# Patient Record
Sex: Female | Born: 1990 | Race: Black or African American | Hispanic: No | Marital: Single | Smoking: Current every day smoker
Health system: Southern US, Community
[De-identification: ages and names within clinical notes are randomized; demographics above are authoritative.]

## PROBLEM LIST (undated history)

## (undated) DIAGNOSIS — D649 Anemia, unspecified: Secondary | ICD-10-CM

## (undated) HISTORY — PX: WISDOM TOOTH EXTRACTION: SHX21

---
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
## 2008-01-16 ENCOUNTER — Emergency Department: Payer: Self-pay | Admitting: Internal Medicine

## 2011-02-02 ENCOUNTER — Emergency Department: Payer: Self-pay | Admitting: Emergency Medicine

## 2011-02-20 DIAGNOSIS — Z8742 Personal history of other diseases of the female genital tract: Secondary | ICD-10-CM | POA: Insufficient documentation

## 2011-04-26 ENCOUNTER — Emergency Department: Payer: Self-pay | Admitting: Emergency Medicine

## 2011-05-23 ENCOUNTER — Ambulatory Visit: Payer: Self-pay | Admitting: Advanced Practice Midwife

## 2011-10-01 ENCOUNTER — Observation Stay: Payer: Self-pay

## 2011-10-01 ENCOUNTER — Inpatient Hospital Stay: Payer: Self-pay | Admitting: Obstetrics and Gynecology

## 2014-02-11 ENCOUNTER — Emergency Department: Payer: Self-pay | Admitting: Emergency Medicine

## 2015-04-05 ENCOUNTER — Encounter: Payer: Self-pay | Admitting: Emergency Medicine

## 2015-04-05 ENCOUNTER — Emergency Department
Admission: EM | Admit: 2015-04-05 | Discharge: 2015-04-05 | Disposition: A | Discharge status: Home / Self Care | Payer: Self-pay | Attending: Student | Admitting: Student

## 2015-04-05 DIAGNOSIS — Y9389 Activity, other specified: Secondary | ICD-10-CM | POA: Insufficient documentation

## 2015-04-05 DIAGNOSIS — R509 Fever, unspecified: Secondary | ICD-10-CM | POA: Insufficient documentation

## 2015-04-05 DIAGNOSIS — S1086XA Insect bite of other specified part of neck, initial encounter: Secondary | ICD-10-CM | POA: Insufficient documentation

## 2015-04-05 DIAGNOSIS — Y998 Other external cause status: Secondary | ICD-10-CM | POA: Insufficient documentation

## 2015-04-05 DIAGNOSIS — S40862A Insect bite (nonvenomous) of left upper arm, initial encounter: Secondary | ICD-10-CM | POA: Insufficient documentation

## 2015-04-05 DIAGNOSIS — Z79899 Other long term (current) drug therapy: Secondary | ICD-10-CM | POA: Insufficient documentation

## 2015-04-05 DIAGNOSIS — R238 Other skin changes: Secondary | ICD-10-CM | POA: Insufficient documentation

## 2015-04-05 DIAGNOSIS — W57XXXA Bitten or stung by nonvenomous insect and other nonvenomous arthropods, initial encounter: Secondary | ICD-10-CM | POA: Insufficient documentation

## 2015-04-05 DIAGNOSIS — Y9289 Other specified places as the place of occurrence of the external cause: Secondary | ICD-10-CM | POA: Insufficient documentation

## 2015-04-05 DIAGNOSIS — B001 Herpesviral vesicular dermatitis: Secondary | ICD-10-CM

## 2015-04-05 DIAGNOSIS — Z720 Tobacco use: Secondary | ICD-10-CM | POA: Insufficient documentation

## 2015-04-05 HISTORY — DX: Anemia, unspecified: D64.9

## 2015-04-05 MED ORDER — RANITIDINE HCL 150 MG PO TABS: 150.0000 mg | ORAL_TABLET | Freq: Two times a day (BID) | ORAL | Status: DC

## 2015-04-05 MED ORDER — HYDROCORTISONE 1 % EX CREA: 1.0000 "application " | TOPICAL_CREAM | Freq: Two times a day (BID) | CUTANEOUS | Status: DC

## 2015-04-05 MED ORDER — ACYCLOVIR 5 % EX CREA: 1.0000 "application " | TOPICAL_CREAM | CUTANEOUS | Status: DC

## 2015-04-05 MED ORDER — HYDROXYZINE PAMOATE 25 MG PO CAPS: ORAL_CAPSULE | ORAL | Status: DC

## 2015-04-05 NOTE — Discharge Instructions (Signed)
Cold Sore A cold sore (fever blister) is a skin infection caused by the herpes simplex virus (HSV-1). HSV-1 is closely related to the virus that causes genital herpes (HSV-2), but they are not the same even though both viruses can cause oral and genital infections. Cold sores are small, fluid-filled sores inside of the mouth or on the lips, gums, nose, chin, cheeks, or fingers.  The herpes simplex virus can be easily passed (contagious) to other people through close personal contact, such as kissing or sharing personal items. The virus can also spread to other parts of the body, such as the eyes or genitals. Cold sores are contagious until the sores crust over completely. They often heal within 2 weeks.  Once a person is infected, the herpes simplex virus remains permanently in the body. Therefore, there is no cure for cold sores, and they often recur when a person is tired, stressed, sick, or gets too much sun. Additional factors that can cause a recurrence include hormone changes in menstruation or pregnancy, certain drugs, and cold weather.  CAUSES  Cold sores are caused by the herpes simplex virus. The virus is spread from person to person through close contact, such as through kissing, touching the affected area, or sharing personal items such as lip balm, razors, or eating utensils.  SYMPTOMS  The first infection may not cause symptoms. If symptoms develop, the symptoms often go through different stages. Here is how a cold sore develops:   Tingling, itching, or burning is felt 1-2 days before the outbreak.   Fluid-filled blisters appear on the lips, inside the mouth, nose, or on the cheeks.   The blisters start to ooze clear fluid.   The blisters dry up and a yellow crust appears in its place.   The crust falls off.  Symptoms depend on whether it is the initial outbreak or a recurrence. Some other symptoms with the first outbreak may include:   Fever.   Sore throat.   Headache.    Muscle aches.   Swollen neck glands.  DIAGNOSIS  A diagnosis is often made based on your symptoms and looking at the sores. Sometimes, a sore may be swabbed and then examined in the lab to make a final diagnosis. If the sores are not present, blood tests can find the herpes simplex virus.  TREATMENT  There is no cure for cold sores and no vaccine for the herpes simplex virus. Within 2 weeks, most cold sores go away on their own without treatment. Medicines cannot make the infection go away, but medicine can help relieve some of the pain associated with the sores, can work to stop the virus from multiplying, and can also shorten healing time. Medicine may be in the form of creams, gels, pills, or a shot.  HOME CARE INSTRUCTIONS   Only take over-the-counter or prescription medicines for pain, discomfort, or fever as directed by your caregiver. Do not use aspirin.   Use a cotton-tip swab to apply creams or gels to your sores.   Do not touch the sores or pick the scabs. Wash your hands often. Do not touch your eyes without washing your hands first.   Avoid kissing, oral sex, and sharing personal items until sores heal.   Apply an ice pack on your sores for 10-15 minutes to ease any discomfort.   Avoid hot, cold, or salty foods because they may hurt your mouth. Eat a soft, bland diet to avoid irritating the sores. Use a straw to drink   if you have pain when drinking out of a glass.   Keep sores clean and dry to prevent an infection of other tissues.   Avoid the sun and limit stress if these things trigger outbreaks. If sun causes cold sores, apply sunscreen on the lips before being out in the sun.  SEEK MEDICAL CARE IF:   You have a fever or persistent symptoms for more than 2-3 days.   You have a fever and your symptoms suddenly get worse.   You have pus, not clear fluid, coming from the sores.   You have redness that is spreading.   You have pain or irritation in your  eye.   You get sores on your genitals.   Your sores do not heal within 2 weeks.   You have a weakened immune system.   You have frequent recurrences of cold sores.  MAKE SURE YOU:   Understand these instructions.  Will watch your condition.  Will get help right away if you are not doing well or get worse. Document Released: 11/15/2000 Document Revised: 04/04/2014 Document Reviewed: 04/01/2012 [facility] Patient Information 2015 [place], [place]. This information is not intended to replace advice given to you by your health care provider. Make sure you discuss any questions you have with your health care provider.  Insect Bite Mosquitoes, flies, fleas, bedbugs, and many other insects can bite. Insect bites are different from insect stings. A sting is when venom is injected into the skin. Some insect bites can transmit infectious diseases. SYMPTOMS  Insect bites usually turn red, swell, and itch for 2 to 4 days. They often go away on their own. TREATMENT  Your caregiver may prescribe antibiotic medicines if a bacterial infection develops in the bite. HOME CARE INSTRUCTIONS  Do not scratch the bite area.  Keep the bite area clean and dry. Wash the bite area thoroughly with soap and water.  Put ice or cool compresses on the bite area.  Put ice in a plastic bag.  Place a towel between your skin and the bag.  Leave the ice on for 20 minutes, 4 times a day for the first 2 to 3 days, or as directed.  You may apply a baking soda paste, cortisone cream, or calamine lotion to the bite area as directed by your caregiver. This can help reduce itching and swelling.  Only take over-the-counter or prescription medicines as directed by your caregiver.  If you are given antibiotics, take them as directed. Finish them even if you start to feel better. You may need a tetanus shot if:  You cannot remember when you had your last tetanus shot.  You have never had a tetanus shot.  The  injury broke your skin. If you get a tetanus shot, your arm may swell, get red, and feel warm to the touch. This is common and not a problem. If you need a tetanus shot and you choose not to have one, there is a rare chance of getting tetanus. Sickness from tetanus can be serious. SEEK IMMEDIATE MEDICAL CARE IF:   You have increased pain, redness, or swelling in the bite area.  You see a red line on the skin coming from the bite.  You have a fever.  You have joint pain.  You have a headache or neck pain.  You have unusual weakness.  You have a rash.  You have chest pain or shortness of breath.  You have abdominal pain, nausea, or vomiting.  You feel unusually tired  or sleepy. MAKE SURE YOU:   Understand these instructions.  Will watch your condition.  Will get help right away if you are not doing well or get worse. Document Released: 12/26/2004 Document Revised: 02/10/2012 Document Reviewed: 06/19/2011 [facility] Patient Information 2015 [place], [place]. This information is not intended to replace advice given to you by your health care provider. Make sure you discuss any questions you have with your health care provider.

## 2015-04-05 NOTE — ED Notes (Signed)
Pt states "possible bug bite" on let arm and left neck.  NAD

## 2015-04-05 NOTE — ED Provider Notes (Signed)
[facility] Provider Note   ____________________________________________  Time seen: 1030  I have reviewed the triage vital signs and the nursing notes.   HISTORY  Chief Complaint Abscess      HPI Dominique Ware is a 24 y.o. female complains of being bitten on her left side of her neck and left upper arm. Noticed symptoms onset yesterday morning. In addition complains of having a cold sore to her left outer lip. Since she's tried rubbing alcohol with no relief and is concerned that it is read irritating hurts and itches. Denies any difficulty swallowing or shortness of breath or chest pains no vision pains denies any nausea vomiting diarrhea constipation .    Past Medical History  Diagnosis Date  . Anemia     There are no active problems to display for this patient.   History reviewed. No pertinent past surgical history.  Current Outpatient Rx  Name  Route  Sig  Dispense  Refill  . acyclovir cream (ZOVIRAX) 5 %   Topical   Apply 1 application topically every 3 (three) hours.   15 g   0   . hydrocortisone cream 1 %   Topical   Apply 1 application topically 2 (two) times daily.   30 g   0   . hydrOXYzine (VISTARIL) 25 MG capsule      Take 1-2 tablets every 6 hours as needed for itching.   30 capsule   0   . ranitidine (ZANTAC) 150 MG tablet   Oral   Take 1 tablet (150 mg total) by mouth 2 (two) times daily.   20 tablet   1     Allergies Review of patient's allergies indicates no known allergies.  History reviewed. No pertinent family history.  Social History History  Substance Use Topics  . Smoking status: Current Some Day Smoker    Types: Cigarettes  . Smokeless tobacco: Never Used  . Alcohol Use: Yes     Comment: on occassion    Review of Systems  Constitutional: Negative for fever. ENT: Negative for sore throat. Cardiovascular: Negative for chest pain. Respiratory: Negative for shortness of  breath. Gastrointestinal: Negative for abdominal pain, vomiting and diarrhea. Skin: Positive for 3 cm lesion to left upper arm. And approximately 1/2 cm lesion to left lateral neck. Also noted is a fever blister noted on the left lower lip. Neurological: Negative for headaches, focal weakness or numbness.   10-point ROS otherwise negative.  ____________________________________________   PHYSICAL EXAM:  VITAL SIGNS: ED Triage Vitals  Enc Vitals Group     BP 04/05/15 0926 108/79 mmHg     Pulse Rate 04/05/15 0926 78     Resp 04/05/15 0926 18     Temp 04/05/15 0926 97.5 F (36.4 C)     Temp Source 04/05/15 0926 Oral     SpO2 04/05/15 0926 98 %     Weight 04/05/15 0926 126 lb (57.153 kg)     Height 04/05/15 0926 5\' 3"  (1.6 m)     Head Cir --      Peak Flow --      Pain Score 04/05/15 0925 6     Pain Loc --      Pain Edu? --      Excl. in GC? --    Constitutional: Alert and oriented. Well appearing and in no distress.  ENT   Head: Normocephalic and atraumatic.   Nose: No congestion/rhinnorhea.   Mouth/Throat: Mucous membranes are  moist.a fever blister noted on the left lower lip.    Neck: No stridor. Hematological/Lymphatic/Immunilogical: No cervical lymphadenopathy. Cardiovascular: Normal rate, regular rhythm. Normal and symmetric distal pulses are present in all extremities. No murmurs, rubs, or gallops. Respiratory: Normal respiratory effort without tachypnea nor retractions. Breath sounds are clear and equal bilaterally. No wheezes/rales/rhonchi. Musculoskeletal: Nontender with normal range of motion in all extremities. No joint effusions.  No lower extremity tenderness nor edema. Neurologic:  Normal speech and language. No gross focal neurologic deficits are appreciated. Speech is normal. No gait instability. Skin:  Skin is warm, dry and intact.Positive for 3 cm lesion to left upper arm. And approximately 1/2 cm lesion to left lateral neck. Also noted is a fever  blister noted on the left lower lip. No rash noted. Psychiatric: Mood and affect are normal. Speech and behavior are normal. Patient exhibits appropriate insight and judgment.  ____________________________________________    LABS (pertinent positives/negatives)    ____________________________________________   EKG    ____________________________________________    RADIOLOGY    ____________________________________________   PROCEDURES  Procedure(s) performed: None  Critical Care performed: No  ____________________________________________   INITIAL IMPRESSION / ASSESSMENT AND PLAN / ED COURSE  Pertinent labs & imaging results that were available during my care of the patient were reviewed by me and considered in my medical decision making (see chart for details).  Reassured patient that lesions were probably due to an insect bite of unknown origin we'll treat conservatively with hydrocortisone cream in Zantac and Atarax for itching return to work as needed. We'll give acyclovir for fever blisters.  ____________________________________________   FINAL CLINICAL IMPRESSION(S) / ED DIAGNOSES  Final diagnoses:  Insect bite of arm, left, initial encounter  Fever blister     Evangeline Dakinharles M Beers, PA-C 04/05/15 1047  Gayla DossEryka A Gayle, MD 04/05/15 [phone]

## 2015-04-05 NOTE — ED Notes (Signed)
Pt unable to sign instructions, as it will not print and esig is not working.  Pt verbalized instrucitons

## 2015-12-03 NOTE — L&D Delivery Note (Signed)
Delivery Note At 6:36 PM a viable and healthy female "Dominique Ware"  was delivered via Vaginal, Spontaneous Delivery (Presentation:ROA ).  APGAR: 8,9 ; weight 2560g .   Placenta status: intact , .  Cord: nuchal x2, 3 vessel cord.   Anesthesia:  none Episiotomy: None Lacerations: None Suture Repair: n/a Est. Blood Loss (mL): 0  Mom to postpartum.  Baby to Couplet care / Skin to Skin.  Pt admitted in active preterm labor and progressed rapidly to fully dilated without anesthesia. She was GBS positive but progressed rapidly without getting full 4 hours of ampicillin. AROM for clear fluid 7 min before delivery. Head delivered with nuchal cord x2, which were delivered through. Delayed cord clamping x60 secs. Baby to maternal abdomen vigorous and crying. Placenta delivered spontaneously and intact with trailing membranes. Postpartum pit started and fundus firm. Uncomplicated delivery.  Christeen DouglasBEASLEY, Carlee Tesfaye 08/02/2016, 7:01 PM

## 2016-01-16 ENCOUNTER — Emergency Department
Admission: EM | Admit: 2016-01-16 | Discharge: 2016-01-16 | Disposition: A | Discharge status: Home / Self Care | Payer: Medicaid Other | Attending: Emergency Medicine | Admitting: Emergency Medicine

## 2016-01-16 ENCOUNTER — Encounter: Payer: Self-pay | Admitting: *Deleted

## 2016-01-16 DIAGNOSIS — Z79899 Other long term (current) drug therapy: Secondary | ICD-10-CM | POA: Insufficient documentation

## 2016-01-16 DIAGNOSIS — N3000 Acute cystitis without hematuria: Secondary | ICD-10-CM

## 2016-01-16 DIAGNOSIS — Z3A01 Less than 8 weeks gestation of pregnancy: Secondary | ICD-10-CM | POA: Insufficient documentation

## 2016-01-16 DIAGNOSIS — O2311 Infections of bladder in pregnancy, first trimester: Secondary | ICD-10-CM | POA: Insufficient documentation

## 2016-01-16 DIAGNOSIS — O2341 Unspecified infection of urinary tract in pregnancy, first trimester: Secondary | ICD-10-CM | POA: Insufficient documentation

## 2016-01-16 DIAGNOSIS — O210 Mild hyperemesis gravidarum: Secondary | ICD-10-CM | POA: Diagnosis present

## 2016-01-16 DIAGNOSIS — O99331 Smoking (tobacco) complicating pregnancy, first trimester: Secondary | ICD-10-CM | POA: Insufficient documentation

## 2016-01-16 DIAGNOSIS — F17210 Nicotine dependence, cigarettes, uncomplicated: Secondary | ICD-10-CM | POA: Diagnosis not present

## 2016-01-16 DIAGNOSIS — Z7952 Long term (current) use of systemic steroids: Secondary | ICD-10-CM | POA: Insufficient documentation

## 2016-01-16 DIAGNOSIS — R112 Nausea with vomiting, unspecified: Secondary | ICD-10-CM

## 2016-01-16 LAB — COMPREHENSIVE METABOLIC PANEL
ALT: 19 U/L (ref 14–54)
AST: 19 U/L (ref 15–41)
Albumin: 4.1 g/dL (ref 3.5–5.0)
Alkaline Phosphatase: 49 U/L (ref 38–126)
Anion gap: 8 (ref 5–15)
BUN: 14 mg/dL (ref 6–20)
CHLORIDE: 105 mmol/L (ref 101–111)
CO2: 24 mmol/L (ref 22–32)
Calcium: 9.2 mg/dL (ref 8.9–10.3)
Creatinine, Ser: 0.51 mg/dL (ref 0.44–1.00)
GFR calc Af Amer: >60 mL/min (ref >60)
GFR calc non Af Amer: >60 mL/min (ref >60)
GLUCOSE: 87 mg/dL (ref 65–99)
Potassium: 3.7 mmol/L (ref 3.5–5.1)
SODIUM: 137 mmol/L (ref 135–145)
Total Bilirubin: 1.2 mg/dL (ref 0.3–1.2)
Total Protein: 7.9 g/dL (ref 6.5–8.1)

## 2016-01-16 LAB — CBC
HEMATOCRIT: 39.2 % (ref 35.0–47.0)
Hemoglobin: 13.3 g/dL (ref 12.0–16.0)
MCH: 28.1 pg (ref 26.0–34.0)
MCHC: 34 g/dL (ref 32.0–36.0)
MCV: 82.7 fL (ref 80.0–100.0)
Platelets: 303 10*3/uL (ref 150–440)
RBC: 4.74 MIL/uL (ref 3.80–5.20)
RDW: 13.6 % (ref 11.5–14.5)
WBC: 7.2 10*3/uL (ref 3.6–11.0)

## 2016-01-16 LAB — URINALYSIS COMPLETE WITH MICROSCOPIC (ARMC ONLY)
Bilirubin Urine: NEGATIVE
GLUCOSE, UA: NEGATIVE mg/dL
HGB URINE DIPSTICK: NEGATIVE
Nitrite: POSITIVE — AB
Protein, ur: 30 mg/dL — AB
Specific Gravity, Urine: 1.025 (ref 1.005–1.030)
pH: 6 (ref 5.0–8.0)

## 2016-01-16 LAB — HCG, QUANTITATIVE, PREGNANCY: HCG, BETA CHAIN, QUANT, S: 119277 m[IU]/mL — AB (ref <5)

## 2016-01-16 LAB — LIPASE, BLOOD: LIPASE: 32 U/L (ref 11–51)

## 2016-01-16 MED ORDER — METOCLOPRAMIDE HCL 10 MG PO TABS
10.0000 mg | ORAL_TABLET | Freq: Once | ORAL | Status: AC
  Administered 2016-01-16: 10 mg via ORAL
  Filled 2016-01-16: qty 1

## 2016-01-16 MED ORDER — METOCLOPRAMIDE HCL 10 MG PO TABS: 10.0000 mg | ORAL_TABLET | Freq: Three times a day (TID) | ORAL | Status: DC | PRN

## 2016-01-16 MED ORDER — CEPHALEXIN 500 MG PO CAPS
500.0000 mg | ORAL_CAPSULE | Freq: Once | ORAL | Status: AC
  Administered 2016-01-16: 500 mg via ORAL
  Filled 2016-01-16: qty 1

## 2016-01-16 MED ORDER — CEPHALEXIN 500 MG PO CAPS: 500.0000 mg | ORAL_CAPSULE | Freq: Three times a day (TID) | ORAL | Status: AC

## 2016-01-16 MED ORDER — ACETAMINOPHEN 325 MG PO TABS
650.0000 mg | ORAL_TABLET | Freq: Once | ORAL | Status: AC
  Administered 2016-01-16: 650 mg via ORAL
  Filled 2016-01-16: qty 2

## 2016-01-16 NOTE — ED Provider Notes (Signed)
[facility] Emergency Department Provider Note  ____________________________________________  Time seen: Approximately 424 AM  I have reviewed the triage vital signs and the nursing notes.   HISTORY  Chief Complaint Nausea and Abdominal Pain    HPI Dominique Ware is a 25 y.o. female who comes in to the hospital today with back pain nausea and vomiting. The patient also reports that she has pain in her sides. The patient is about [redacted] weeks pregnant and has prenatal vitamins but does not take them. The patient has not yet seen a doctor for her prenatal care because she so early. She reports that throughout the day she gets sick and cannot keep liquids down. She reports that her sides and her back hurt more than usual and she is unsure if that is due to her standing for multiple hours a day. Due to the symptoms the patient wanted to get seen. She's had back pain since the beginning of her pregnancy and she found out about February 1. The patient's last unsure. Was the end of December and she is a G2 P1. The patient rates her pain a 5 out of 10 in intensity but reports that when the batter to 7. She's had no fevers and no pain with urination.   Past Medical History  Diagnosis Date  . Anemia     There are no active problems to display for this patient.   No past surgical history  Current Outpatient Rx  Name  Route  Sig  Dispense  Refill  . acyclovir cream (ZOVIRAX) 5 %   Topical   Apply 1 application topically every 3 (three) hours.   15 g   0   . cephALEXin (KEFLEX) 500 MG capsule   Oral   Take 1 capsule (500 mg total) by mouth 3 (three) times daily.   30 capsule   0   . hydrocortisone cream 1 %   Topical   Apply 1 application topically 2 (two) times daily.   30 g   0   . hydrOXYzine (VISTARIL) 25 MG capsule      Take 1-2 tablets every 6 hours as needed for itching.   30 capsule   0   . metoCLOPramide (REGLAN) 10 MG tablet   Oral   Take 1  tablet (10 mg total) by mouth every 8 (eight) hours as needed for nausea.   20 tablet   0   . ranitidine (ZANTAC) 150 MG tablet   Oral   Take 1 tablet (150 mg total) by mouth 2 (two) times daily.   20 tablet   1     Allergies Review of patient's allergies indicates no known allergies.  No family history on file.  Social History Social History  Substance Use Topics  . Smoking status: Current Some Day Smoker    Types: Cigarettes  . Smokeless tobacco: Never Used  . Alcohol Use: Yes     Comment: on occassion    Review of Systems Constitutional: No fever/chills Eyes: No visual changes. ENT: No sore throat. Cardiovascular: Denies chest pain. Respiratory: Denies shortness of breath. Gastrointestinal: Nausea and vomiting with No abdominal pain  No diarrhea.  No constipation. Genitourinary: Negative for dysuria. Musculoskeletal:  back pain. Skin: Negative for rash. Neurological: Negative for headaches, focal weakness or numbness.  10-point ROS otherwise negative.  ____________________________________________   PHYSICAL EXAM:  VITAL SIGNS: ED Triage Vitals  Enc Vitals Group     BP 01/16/16 0146 115/80 mmHg  Pulse Rate 01/16/16 0146 75     Resp 01/16/16 0146 20     Temp 01/16/16 0146 97.8 F (36.6 C)     Temp Source 01/16/16 0146 Oral     SpO2 01/16/16 0146 99 %     Weight 01/16/16 0146 116 lb (52.617 kg)     Height 01/16/16 0146  (1.575 m)     Head Cir --      Peak Flow --      Pain Score 01/16/16 0146 6     Pain Loc --      Pain Edu? --      Excl. in GC? --     Constitutional: Alert and oriented. Well appearing and in mild distress. Eyes: Conjunctivae are normal. PERRL. EOMI. Head: Atraumatic. Nose: No congestion/rhinnorhea. Mouth/Throat: Mucous membranes are moist.  Oropharynx non-erythematous. Cardiovascular: Normal rate, regular rhythm. Grossly normal heart sounds.  Good peripheral circulation. Respiratory: Normal respiratory effort.  No  retractions. Lungs CTAB. Gastrointestinal: Soft and nontender. No distention. Positive bowel sounds with bilateral CVA tenderness to palpation Musculoskeletal: No lower extremity tenderness nor edema.  Neurologic:  Normal speech and language.  Skin:  Skin is warm, dry and intact.  Psychiatric: Mood and affect are normal.  ____________________________________________   LABS (all labs ordered are listed, but only abnormal results are displayed)  Labs Reviewed  URINALYSIS COMPLETEWITH MICROSCOPIC ([facility] ONLY) - Abnormal; Notable for the following:    Color, Urine AMBER (*)    APPearance CLOUDY (*)    Ketones, ur TRACE (*)    Protein, ur 30 (*)    Nitrite POSITIVE (*)    Leukocytes, UA 1+ (*)    Bacteria, UA MANY (*)    Squamous Epithelial / LPF 0-5 (*)    All other components within normal limits  HCG, QUANTITATIVE, PREGNANCY - Abnormal; Notable for the following:    hCG, Beta Francene Finders [id number] (*)    All other components within normal limits  URINE CULTURE  LIPASE, BLOOD  COMPREHENSIVE METABOLIC PANEL  CBC   ____________________________________________  EKG  None ____________________________________________  RADIOLOGY  None ____________________________________________   PROCEDURES  Procedure(s) performed: None  Critical Care performed: No  ____________________________________________   INITIAL IMPRESSION / ASSESSMENT AND PLAN / ED COURSE  Pertinent labs & imaging results that were available during my care of the patient were reviewed by me and considered in my medical decision making (see chart for details).  This is a 25 year old female who comes to the hospital with bilateral back pain. The patient is [redacted] weeks pregnant. On evaluation the patient has a normal CBC and a normal CMP but it appears she does have a urinary tract infection. She has too numerous to count white blood cells, bacteria and positive nitrates. I will give the patient a dose of Reglan,  Tylenol and some Keflex. If the patient is able to keep it down she'll be discharged home.  The patient was able to take her medication without any episodes of vomiting. The patient feels improved and she is ready to be discharged home. The patient should follow-up with health department or with Dr. Valentino Saxon from OB/GYN. ____________________________________________   FINAL CLINICAL IMPRESSION(S) / ED DIAGNOSES  Final diagnoses:  Acute cystitis without hematuria  UTI in pregnancy, antepartum, first trimester  Non-intractable vomiting with nausea, vomiting of unspecified type      Rebecka Apley, MD 01/16/16 [phone]

## 2016-01-16 NOTE — Discharge Instructions (Signed)
Nausea and Vomiting °Nausea is a sick feeling that often comes before throwing up (vomiting). Vomiting is a reflex where stomach contents come out of your mouth. Vomiting can cause severe loss of body fluids (dehydration). Children and elderly adults can become dehydrated quickly, especially if they also have diarrhea. Nausea and vomiting are symptoms of a condition or disease. It is important to find the cause of your symptoms. °CAUSES  °· Direct irritation of the stomach lining. This irritation can result from increased acid production (gastroesophageal reflux disease), infection, food poisoning, taking certain medicines (such as nonsteroidal anti-inflammatory drugs), alcohol use, or tobacco use. °· Signals from the brain. These signals could be caused by a headache, heat exposure, an inner ear disturbance, increased pressure in the brain from injury, infection, a tumor, or a concussion, pain, emotional stimulus, or metabolic problems. °· An obstruction in the gastrointestinal tract (bowel obstruction). °· Illnesses such as diabetes, hepatitis, gallbladder problems, appendicitis, kidney problems, cancer, sepsis, atypical symptoms of a heart attack, or eating disorders. °· Medical treatments such as chemotherapy and radiation. °· Receiving medicine that makes you sleep (general anesthetic) during surgery. °DIAGNOSIS °Your caregiver may ask for tests to be done if the problems do not improve after a few days. Tests may also be done if symptoms are severe or if the reason for the nausea and vomiting is not clear. Tests may include: °· Urine tests. °· Blood tests. °· Stool tests. °· Cultures (to look for evidence of infection). °· X-rays or other imaging studies. °Test results can help your caregiver make decisions about treatment or the need for additional tests. °TREATMENT °You need to stay well hydrated. Drink frequently but in small amounts. You may wish to drink water, sports drinks, clear broth, or eat frozen  ice pops or gelatin dessert to help stay hydrated. When you eat, eating slowly may help prevent nausea. There are also some antinausea medicines that may help prevent nausea. °HOME CARE INSTRUCTIONS  °· Take all medicine as directed by your caregiver. °· If you do not have an appetite, do not force yourself to eat. However, you must continue to drink fluids. °· If you have an appetite, eat a normal diet unless your caregiver tells you differently. °¨ Eat a variety of complex carbohydrates (rice, wheat, potatoes, bread), lean meats, yogurt, fruits, and vegetables. °¨ Avoid high-fat foods because they are more difficult to digest. °· Drink enough water and fluids to keep your urine clear or pale yellow. °· If you are dehydrated, ask your caregiver for specific rehydration instructions. Signs of dehydration may include: °¨ Severe thirst. °¨ Dry lips and mouth. °¨ Dizziness. °¨ Dark urine. °¨ Decreasing urine frequency and amount. °¨ Confusion. °¨ Rapid breathing or pulse. °SEEK IMMEDIATE MEDICAL CARE IF:  °· You have blood or brown flecks (like coffee grounds) in your vomit. °· You have black or bloody stools. °· You have a severe headache or stiff neck. °· You are confused. °· You have severe abdominal pain. °· You have chest pain or trouble breathing. °· You do not urinate at least once every 8 hours. °· You develop cold or clammy skin. °· You continue to vomit for longer than 24 to 48 hours. °· You have a fever. °MAKE SURE YOU:  °· Understand these instructions. °· Will watch your condition. °· Will get help right away if you are not doing well or get worse. °  °This information is not intended to replace advice given to you by your health care provider. Make sure   you discuss any questions you have with your health care provider. °  °Document Released: 11/18/2005 Document Revised: 02/10/2012 Document Reviewed: 04/17/2011 °Elsevier Interactive Patient Education ©2016 [employer] °Pregnancy and Urinary Tract  Infection °A urinary tract infection (UTI) is a bacterial infection of the urinary tract. Infection of the urinary tract can include the ureters, kidneys (pyelonephritis), bladder (cystitis), and urethra (urethritis). All pregnant women should be screened for bacteria in the urinary tract. Identifying and treating a UTI will decrease the risk of preterm labor and developing more serious infections in both the mother and baby. °CAUSES °Bacteria germs cause almost all UTIs.  °RISK FACTORS °Many factors can increase your chances of getting a UTI during pregnancy. These include: °· Having a short urethra. °· Poor toilet and hygiene habits. °· Sexual intercourse. °· Blockage of urine along the urinary tract. °· Problems with the pelvic muscles or nerves. °· Diabetes. °· Obesity. °· Bladder problems after having several children. °· Previous history of UTI. °SIGNS AND SYMPTOMS  °· Pain, burning, or a stinging feeling when urinating. °· Suddenly feeling the need to urinate right away (urgency). °· Loss of bladder control (urinary incontinence). °· Frequent urination, more than is common with pregnancy. °· Lower abdominal or back discomfort. °· Cloudy urine. °· Blood in the urine (hematuria). °· Fever.  °When the kidneys are infected, the symptoms may be: °· Back pain. °· Flank pain on the right side more so than the left. °· Fever. °· Chills. °· Nausea. °· Vomiting. °DIAGNOSIS  °A urinary tract infection is usually diagnosed through urine tests. Additional tests and procedures are sometimes done. These may include: °· Ultrasound exam of the kidneys, ureters, bladder, and urethra. °· Looking in the bladder with a lighted tube (cystoscopy). °TREATMENT °Typically, UTIs can be treated with antibiotic medicines.  °HOME CARE INSTRUCTIONS  °· Only take over-the-counter or prescription medicines as directed by your health care provider. If you were prescribed antibiotics, take them as directed. Finish them even if you start to  feel better. °· Drink enough fluids to keep your urine clear or pale yellow. °· Do not have sexual intercourse until the infection is gone and your health care provider says it is okay. °· Make sure you are tested for UTIs throughout your pregnancy. These infections often come back.  °Preventing a UTI in the Future °· Practice good toilet habits. Always wipe from front to back. Use the tissue only once. °· Do not hold your urine. Empty your bladder as soon as possible when the urge comes. °· Do not douche or use deodorant sprays. °· Wash with soap and warm water around the genital area and the anus. °· Empty your bladder before and after sexual intercourse. °· Wear underwear with a cotton crotch. °· Avoid caffeine and carbonated drinks. They can irritate the bladder. °· Drink cranberry juice or take cranberry pills. This may decrease the risk of getting a UTI. °· Do not drink alcohol. °· Keep all your appointments and tests as scheduled.  °SEEK MEDICAL CARE IF:  °· Your symptoms get worse. °· You are still having fevers 2 or more days after treatment begins. °· You have a rash. °· You feel that you are having problems with medicines prescribed. °· You have abnormal vaginal discharge. °SEEK IMMEDIATE MEDICAL CARE IF:  °· You have back or flank pain. °· You have chills. °· You have blood in your urine. °· You have nausea and vomiting. °· You have contractions of your uterus. °· You have a gush   of fluid from the vagina. °MAKE SURE YOU: °· Understand these instructions.   °· Will watch your condition.   °· Will get help right away if you are not doing well or get worse.   °  °This information is not intended to replace advice given to you by your health care provider. Make sure you discuss any questions you have with your health care provider. °  °Document Released: 03/15/2011 Document Revised: 09/08/2013 Document Reviewed: 06/17/2013 °Elsevier Interactive Patient Education ©2016 [employer]. ° °

## 2016-01-16 NOTE — ED Notes (Addendum)
Pt reports being 6.[redacted] weeks pregnant, reports n/v w/ lower abd and back pain x 1 week.  Pt reports this is second pregnancy.  Pt reports vomit x 6 in last 24 hours.  Pt reports diarrhea throughout pregnancy, x3 in past 24 hours.  Pt NAD at this time, respirations equal and unlabored.

## 2016-01-16 NOTE — ED Notes (Signed)
Pt reports she has pressure and pain in lower abd .  Pt is approx [redacted] weeks pregnant.  No vag bleeding.  Pt reports low back pain.

## 2016-01-18 LAB — URINE CULTURE

## 2016-02-14 LAB — OB RESULTS CONSOLE RUBELLA ANTIBODY, IGM: RUBELLA: IMMUNE

## 2016-02-14 LAB — OB RESULTS CONSOLE HEPATITIS B SURFACE ANTIGEN: Hepatitis B Surface Ag: NEGATIVE

## 2016-02-14 LAB — OB RESULTS CONSOLE HIV ANTIBODY (ROUTINE TESTING): HIV: NONREACTIVE

## 2016-02-14 LAB — OB RESULTS CONSOLE VARICELLA ZOSTER ANTIBODY, IGG: VARICELLA IGG: IMMUNE

## 2016-03-19 ENCOUNTER — Other Ambulatory Visit: Payer: Self-pay | Admitting: Obstetrics and Gynecology

## 2016-03-19 DIAGNOSIS — R772 Abnormality of alphafetoprotein: Secondary | ICD-10-CM

## 2016-04-01 ENCOUNTER — Ambulatory Visit: Payer: Medicaid Other | Attending: Obstetrics and Gynecology

## 2016-04-01 ENCOUNTER — Ambulatory Visit: Payer: Medicaid Other

## 2016-04-04 ENCOUNTER — Ambulatory Visit (HOSPITAL_BASED_OUTPATIENT_CLINIC_OR_DEPARTMENT_OTHER)
Admission: RE | Admit: 2016-04-04 | Discharge: 2016-04-04 | Disposition: A | Discharge status: Home / Self Care | Payer: Medicaid Other | Source: Ambulatory Visit | Attending: Obstetrics and Gynecology | Admitting: Obstetrics and Gynecology

## 2016-04-04 ENCOUNTER — Ambulatory Visit
Admission: RE | Admit: 2016-04-04 | Discharge: 2016-04-04 | Disposition: A | Discharge status: Home / Self Care | Payer: Medicaid Other | Source: Ambulatory Visit | Attending: Obstetrics and Gynecology | Admitting: Obstetrics and Gynecology

## 2016-04-04 VITALS — BP 113/73 | HR 98 | Temp 98.2°F | Resp 18 | Ht 63.6 in | Wt 118.6 lb

## 2016-04-04 DIAGNOSIS — Z36 Encounter for antenatal screening of mother: Secondary | ICD-10-CM | POA: Diagnosis not present

## 2016-04-04 DIAGNOSIS — R772 Abnormality of alphafetoprotein: Secondary | ICD-10-CM

## 2016-04-04 DIAGNOSIS — Z3A19 19 weeks gestation of pregnancy: Secondary | ICD-10-CM | POA: Insufficient documentation

## 2016-04-04 NOTE — Progress Notes (Addendum)
Referring Provider:  [facility] Length of consultation 30 minutes  Dominique Ware was referred for genetic counseling to discuss the results of her Maternal Serum Screen and her prenatal testing options.  This note is a summary of our discussion.  To review, a Maternal Serum AFP is a maternal blood test that measures maternal serum AFP levels to determine if a pregnancy is at higher risk for certain birth defects or problems; however, it cannot diagnose or rule out these conditions.  It is important to remember that an abnormal maternal serum screen (MSS) test does not necessarily mean that the baby has a problem.  Maternal serum screening can identify approximately 80% of open neural tube defects (i.e., spina bifida).  The neural tube consists of the fetal head and spine, and if this structure fails to close during development, spina bifida (open spine) or anencephaly (open skull) could result.   The MSS result revealed an increased level of AFP.  The value was 2.86 MOM (multiples of the median) which is above the cutoff of 2.80 MOM.  Increased levels of AFP occur for many reasons including: inaccurate pregnancy dating, the presence of twins, pregnancy complications, neural tube defects, abdominal wall defects or a normal pregnancy.  Before MSS screening, her risk to have a child with a neural tube defect was 1 or 2 per 1000. Given the elevated AFP value, the risk is now 1 in 299.  This means that the chance that this baby does not have a neural tube defect is >99.5.  We discussed the following prenatal testing options for this pregnancy:   Ultrasound was recommended to evaluate the fetal anatomy, particularly the neural tube and to confirm the gestational age.  An ultrasound performed on 04/04/2016 revealed that the patient was 19 weeks and 5 days pregnant.  There was no evidence of a neural tube defect on this ultrasound.  The fetal anatomy appeared normal, although this cannot rule out all  abnormalities.  Redraw AFP is offered when the level of AFP MoM does not exceed a particular laboratory cutoff.  Redrawing maternal blood for a repeat can provide patients with a better assessment of their risk to have a child with a neural tube defect.  Amniocentesis was offered because the AFP MoM exceeded a particular laboratory cutoff.  Amniocentesis can detect greater than 98% of neural tube defects.  It involves the removal of a small amount of amniotic fluid from the sac surrounding the fetus with the use of a thin needle through the maternal abdomen and uterus.  Fetal cells are directly evaluated and approximately 99% of chromosome conditions may also be detected.  Ultrasound guidance is used throughout the procedure. The main risks to this procedure include complications leading to miscarriage in less than 1 in 200 cases (0.5%).    It is important to remember that each pregnancy in the general population has a 2-3% chance for a birth defect that might not be detected prenatally.  We recommend 0.4mg  of folic acid, a B-complex vitamin, for all women of childbearing age.  Folic acid may help to prevent neural tube defects, and should be taken prior to and during pregnancy.  We obtained a detailed family history. There are no other family members with known genetic conditions, mental retardation or birth defects.  The patient and her partner are both of African American ancestry.  In reviewing her labs, she has a normal MCV and had a normal hemoglobin solubility.  The solubility is accurate for  assessment of S trait, but cannot detect other hemoglobin variants.  If more accurate testing is desired, we would recommend hemoglobin electrophoresis.  We also obtained a detailed pregnancy history. Dominique Ware stated that this is her second pregnancy.  She has a healthy 27 year old son from a prior relationship.  The father of the baby, Dominique Ware, has three healthy children.  Dominique Ware  reported no complications  thus far and deny the use of prescription medications, recreational drugs or alcohol.  She reported smoking 2-3 cigarettes per day.  As we discussed, smoking during pregnancy has been associated with low birth weight, premature delivery and pregnancy loss.  For this reason, we suggest that she cut back or avoid smoking for the remainder of the pregnancy.  After careful consideration, Dominique Ware elected to have a repeat msAFP drawn today.  Unexplained elevations of maternal serum AFP have been associated with adverse pregnancy outcomes such as low birth weight, preterm labor, pre-eclampsia or fetal demise.  For this reason, another ultrasound should be considered at 26 to [redacted] weeks gestation to reassess fetal growth.  If further questions or concerns arise, please do not hesitate to call us at [phone].  Cherly Andersoneborah F. Toyoko Silos, MS, CGC  Addendum 04/15/2016- note corrected with proper ultrasound dating due to typo.

## 2016-04-04 NOTE — Progress Notes (Signed)
Pt met with genetic counselor  U/s normal afp was repeated  Needs 3rd tri growth scan

## 2016-04-06 LAB — MISC LABCORP TEST (SEND OUT)
LabCorp test name: 23
Labcorp test code: 10801

## 2016-04-08 ENCOUNTER — Telehealth: Payer: Self-pay | Admitting: Obstetrics and Gynecology

## 2016-04-08 NOTE — Telephone Encounter (Signed)
[name] was contacted regarding the results of her repeat msAFP testing [facility].  She was seen that day for genetic counseling and an ultrasound due to an elevated msAFP result that showed an increased risk for an open neural tube defect.  The repeat results are within normal limits, at 2.16 MoM, giving a revised chance of 1 in 1,139 for an open neural tube defect.  This, combined with the normal anatomy ultrasound, is very reassuring.  We have scheduled a follow up ultrasound for growth in 06/27/2016 due to the initial unexplained elevated AFP.  Please contact our office at [phone] with any questions or concerns.  Cherly Andersoneborah F. Kimberlly Norgard, MS, CGC

## 2016-04-15 NOTE — Addendum Note (Signed)
Encounter addended by: Katrina Stackeborah Marylen Zuk on: 04/15/2016 12:18 PM<BR>     Documentation filed: Notes Section

## 2016-06-27 ENCOUNTER — Ambulatory Visit
Admission: RE | Admit: 2016-06-27 | Discharge: 2016-06-27 | Disposition: A | Discharge status: Home / Self Care | Payer: Medicaid Other | Source: Ambulatory Visit | Attending: Maternal & Fetal Medicine | Admitting: Maternal & Fetal Medicine

## 2016-06-27 DIAGNOSIS — Z3A32 32 weeks gestation of pregnancy: Secondary | ICD-10-CM | POA: Insufficient documentation

## 2016-06-27 DIAGNOSIS — R772 Abnormality of alphafetoprotein: Secondary | ICD-10-CM | POA: Diagnosis present

## 2016-06-27 DIAGNOSIS — Z3483 Encounter for supervision of other normal pregnancy, third trimester: Secondary | ICD-10-CM | POA: Insufficient documentation

## 2016-07-02 ENCOUNTER — Encounter: Payer: Medicaid Other | Attending: Obstetrics and Gynecology | Admitting: *Deleted

## 2016-07-02 ENCOUNTER — Encounter: Payer: Self-pay | Admitting: *Deleted

## 2016-07-02 VITALS — BP 98/60 | Ht 63.0 in | Wt 124.2 lb

## 2016-07-02 DIAGNOSIS — O24419 Gestational diabetes mellitus in pregnancy, unspecified control: Secondary | ICD-10-CM | POA: Insufficient documentation

## 2016-07-02 DIAGNOSIS — Z6822 Body mass index (BMI) 22.0-22.9, adult: Secondary | ICD-10-CM | POA: Diagnosis not present

## 2016-07-02 DIAGNOSIS — Z713 Dietary counseling and surveillance: Secondary | ICD-10-CM | POA: Insufficient documentation

## 2016-07-02 DIAGNOSIS — O24410 Gestational diabetes mellitus in pregnancy, diet controlled: Secondary | ICD-10-CM

## 2016-07-02 NOTE — Patient Instructions (Signed)
Read booklet on Gestational Diabetes Follow Gestational Meal Planning Guidelines Complete a 3 Day Food Record and bring to next appointment Avoid sugar sweetened beverages Check blood sugars 4 x day - before breakfast and 2 hrs after every meal and record  Bring blood sugar log to all appointments Call MD for prescription for meter strips and lancets Strips   Accu-Chek Guide  Lancets  Accu-Chek FastClix Purchase urine ketone strips if blood sugars not controlled (pt not able to afford strips) and check urine ketones every am:  If + increase bedtime snack to 1 protein and 2 carbohydrate servings Walk 20-30 minutes at least 5 x week if permitted by MD Quit smoking

## 2016-07-02 NOTE — Progress Notes (Signed)
Diabetes Self-Management Education  Visit Type: First/Initial  Appt. Start Time: 0905 Appt. End Time: 1045  07/02/2016  [name], identified by name and date of birth, is a 25 y.o. female with a diagnosis of Diabetes: Gestational Diabetes.   ASSESSMENT  Blood pressure 98/60, height  (1.6 m), weight 124 lb 3.2 oz (56.3 kg), last menstrual period 11/29/2015. Body mass index is 22 kg/m.      Diabetes Self-Management Education - 07/02/16 1102      Visit Information   Visit Type First/Initial     Initial Visit   Diabetes Type Gestational Diabetes   Are you currently following a meal plan? No   Are you taking your medications as prescribed? Yes   Date Diagnosed last month     Health Coping   How would you rate your overall health? Fair     Psychosocial Assessment   Patient Belief/Attitude about Diabetes Other (comment)  overwhelmed   Self-care barriers Lack of material resources   Self-management support Doctor's office   Patient Concerns Nutrition/Meal planning;Monitoring;Glycemic Control   Special Needs None   Preferred Learning Style Auditory;Visual;Hands on   Learning Readiness Ready   How often do you need to have someone help you when you read instructions, pamphlets, or other written materials from your doctor or pharmacy? 1 - Never   What is the last grade level you completed in school? 12th grade     Pre-Education Assessment   Patient understands the diabetes disease and treatment process. Needs Instruction   Patient understands incorporating nutritional management into lifestyle. Needs Instruction   Patient undertands incorporating physical activity into lifestyle. Needs Instruction   Patient understands using medications safely. Needs Instruction   Patient understands monitoring blood glucose, interpreting and using results Needs Instruction   Patient understands prevention, detection, and treatment of acute complications. Needs Instruction   Patient understands prevention, detection, and treatment of chronic complications. Needs Instruction   Patient understands how to develop strategies to address psychosocial issues. Needs Instruction   Patient understands how to develop strategies to promote health/change behavior. Needs Instruction     Complications   How often do you check your blood sugar? 0 times/day (not testing)  Provided Accu-Chek Guide meter and instructed on use. BG upon return demonstration was 80 mg/dL at 19:14 am - 2 hrs pp.    Have you had a dilated eye exam in the past 12 months? No   Have you had a dental exam in the past 12 months? No   Are you checking your feet? No     Dietary Intake   Breakfast cereal or egg sandwich or sausage/bacon or meat biscuit   Snack (morning) sleeps - works 3rd shift   Lunch 5 pm - meat with rice, green beans, potatoes, salad   Snack (afternoon) 11 pm - fruit or peanut butter crackers   Dinner 1 am - left overs   Snack (evening) 3:30 am - fruit or peanut butter crackers   Beverage(s) water, soda, juice, lemonade, Kool-aid     Exercise   Exercise Type ADL's     Patient Education   Previous Diabetes Education No   Disease state  Definition of diabetes, type 1 and 2, and the diagnosis of diabetes   Nutrition management  Role of diet in the treatment of diabetes and the relationship between the three main macronutrients and blood glucose level   Physical activity and exercise  Role of exercise on diabetes management, blood pressure control  and cardiac health.   Monitoring Taught/evaluated SMBG meter.;Purpose and frequency of SMBG.;Identified appropriate SMBG and/or A1C goals.Ketone testing, when, how.;   Chronic complications Relationship between chronic complications and blood glucose control   Psychosocial adjustment Role of stress on diabetes;Identified and addressed patients feelings and concerns about diabetes   Preconception care Pregnancy and GDM  Role of pre-pregnancy  blood glucose control on the development of the fetus;Reviewed with patient blood glucose goals with pregnancy;Role of family planning for patients with diabetes   Personal strategies to promote health Review risk of smoking and offered smoking cessation     Individualized Goals (developed by patient)   Reducing Risk Improve blood sugars Prevent diabetes complications Become more fit Quit smoking     Outcomes   Expected Outcomes Demonstrated interest in learning. Expect positive outcomes      Individualized Plan for Diabetes Self-Management Training:   Learning Objective:  Patient will have a greater understanding of diabetes self-management. Patient education plan is to attend individual and/or group sessions per assessed needs and concerns.   Plan:   Patient Instructions  Read booklet on Gestational Diabetes Follow Gestational Meal Planning Guidelines Complete a 3 Day Food Record and bring to next appointment Avoid sugar sweetened beverages Check blood sugars 4 x day - before breakfast and 2 hrs after every meal and record  Bring blood sugar log to all appointments Call MD for prescription for meter strips and lancets Strips   Accu-Chek Guide  Lancets  Accu-Chek FastClix Purchase urine ketone strips if blood sugars not controlled (pt not able to afford strips) and check urine ketones every am:  If + increase bedtime snack to 1 protein and 2 carbohydrate servings Walk 20-30 minutes at least 5 x week if permitted by MD Quit smoking   Expected Outcomes:  Demonstrated interest in learning. Expect positive outcomes  Education material provided:  Gestational Booklet Gestational Meal Planning Guidelines Viewed Gestational Diabetes Video Meter -  Accu-check Guide 3 Day Food Record Goals for a Healthy Pregnancy  If problems or questions, patient to contact team via:  Sharion Settler, RN, CCM, CDE [phone]   Future DSME appointment:  Wednesday July 10, 2016 with  dietitian

## 2016-07-10 ENCOUNTER — Encounter: Payer: Medicaid Other | Admitting: Dietician

## 2016-07-10 ENCOUNTER — Encounter: Payer: Self-pay | Admitting: Dietician

## 2016-07-10 VITALS — BP 108/64 | Ht 63.0 in | Wt 131.1 lb

## 2016-07-10 DIAGNOSIS — Z713 Dietary counseling and surveillance: Secondary | ICD-10-CM | POA: Diagnosis not present

## 2016-07-10 DIAGNOSIS — O24410 Gestational diabetes mellitus in pregnancy, diet controlled: Secondary | ICD-10-CM

## 2016-07-10 NOTE — Progress Notes (Signed)
   Patient's BG record indicates BGs are within goal ranges; testing times vary due to 3rd shift schedule. She misses some tests, but is trying to test regularly.   Patient's food diary indicates generally balanced meals. She reports difficulty with some food options due to limited finances -- she does get food stamps. Emphasized importance of some carbohydrate with a protein source, discussed low-cost protein options.    Provided 1900kcal meal plan, and wrote individualized menus based on patient's food preferences.  Instructed patient on food safety, including avoidance of Listeriosis, and limiting mercury from fish.  Discussed importance of maintaining healthy lifestyle habits to reduce risk of Type 2 DM as well as Gestational DM with any future pregnancies.  Advised patient to use any remaining testing supplies to test some BGs after delivery, and to have BG tested ideally annually, as well as prior to attempting future pregnancies.

## 2016-07-10 NOTE — Patient Instructions (Signed)
   Keep eating every 3-4 hours while you are awake. If you feel nauseated, eat a small amount of whatever food appeals to you, and if you need to sip on a soda, do so slowly.   Have some sort of starch and protein with each meal to keep your blood sugar steady.

## 2016-07-24 ENCOUNTER — Other Ambulatory Visit: Payer: Self-pay

## 2016-07-24 DIAGNOSIS — O359XX1 Maternal care for (suspected) fetal abnormality and damage, unspecified, fetus 1: Secondary | ICD-10-CM

## 2016-07-25 ENCOUNTER — Ambulatory Visit
Admission: RE | Admit: 2016-07-25 | Discharge: 2016-07-25 | Disposition: A | Discharge status: Home / Self Care | Payer: Medicaid Other | Source: Ambulatory Visit | Attending: Obstetrics & Gynecology | Admitting: Obstetrics & Gynecology

## 2016-07-25 DIAGNOSIS — O359XX1 Maternal care for (suspected) fetal abnormality and damage, unspecified, fetus 1: Secondary | ICD-10-CM | POA: Insufficient documentation

## 2016-07-25 DIAGNOSIS — Z3A35 35 weeks gestation of pregnancy: Secondary | ICD-10-CM | POA: Diagnosis not present

## 2016-07-25 LAB — OB RESULTS CONSOLE GC/CHLAMYDIA
CHLAMYDIA, DNA PROBE: POSITIVE
GC PROBE AMP, GENITAL: NEGATIVE

## 2016-07-25 LAB — OB RESULTS CONSOLE RPR: RPR: NONREACTIVE

## 2016-08-02 ENCOUNTER — Inpatient Hospital Stay
Admission: EM | Admit: 2016-08-02 | Discharge: 2016-08-04 | DRG: 774 | Disposition: A | Discharge status: Home / Self Care | Payer: Medicaid Other | Attending: Obstetrics and Gynecology | Admitting: Obstetrics and Gynecology

## 2016-08-02 ENCOUNTER — Encounter: Payer: Self-pay | Admitting: *Deleted

## 2016-08-02 DIAGNOSIS — Z8632 Personal history of gestational diabetes: Secondary | ICD-10-CM | POA: Diagnosis not present

## 2016-08-02 DIAGNOSIS — O99334 Smoking (tobacco) complicating childbirth: Secondary | ICD-10-CM | POA: Diagnosis present

## 2016-08-02 DIAGNOSIS — O99824 Streptococcus B carrier state complicating childbirth: Secondary | ICD-10-CM | POA: Diagnosis present

## 2016-08-02 DIAGNOSIS — O2492 Unspecified diabetes mellitus in childbirth: Secondary | ICD-10-CM | POA: Diagnosis present

## 2016-08-02 DIAGNOSIS — F17210 Nicotine dependence, cigarettes, uncomplicated: Secondary | ICD-10-CM | POA: Diagnosis present

## 2016-08-02 DIAGNOSIS — O09899 Supervision of other high risk pregnancies, unspecified trimester: Secondary | ICD-10-CM

## 2016-08-02 LAB — RAPID HIV SCREEN (HIV 1/2 AB+AG)
HIV 1/2 Antibodies: NONREACTIVE
HIV-1 P24 ANTIGEN - HIV24: NONREACTIVE

## 2016-08-02 LAB — CBC
HCT: 35.8 % (ref 35.0–47.0)
HEMOGLOBIN: 12 g/dL (ref 12.0–16.0)
MCH: 28.7 pg (ref 26.0–34.0)
MCHC: 33.6 g/dL (ref 32.0–36.0)
MCV: 85.2 fL (ref 80.0–100.0)
Platelets: 248 10*3/uL (ref 150–440)
RBC: 4.2 MIL/uL (ref 3.80–5.20)
RDW: 13.9 % (ref 11.5–14.5)
WBC: 6.6 10*3/uL (ref 3.6–11.0)

## 2016-08-02 LAB — TYPE AND SCREEN
ABO/RH(D): O POS
Antibody Screen: NEGATIVE

## 2016-08-02 LAB — GLUCOSE, CAPILLARY: Glucose-Capillary: 77 mg/dL (ref 65–99)

## 2016-08-02 MED ORDER — OXYCODONE-ACETAMINOPHEN 5-325 MG PO TABS
1.0000 | ORAL_TABLET | ORAL | Status: DC | PRN
  Administered 2016-08-02 – 2016-08-03 (×2): 1 via ORAL
  Filled 2016-08-02 (×2): qty 1

## 2016-08-02 MED ORDER — OXYTOCIN 40 UNITS IN LACTATED RINGERS INFUSION - SIMPLE MED
INTRAVENOUS | Status: AC
Start: 2016-08-02 — End: 2016-08-02
  Administered 2016-08-02: 40 [IU] via INTRAVENOUS
  Filled 2016-08-02: qty 1000

## 2016-08-02 MED ORDER — ONDANSETRON HCL 4 MG/2ML IJ SOLN
INTRAMUSCULAR | Status: AC
  Administered 2016-08-02: 4 mg via INTRAVENOUS
  Filled 2016-08-02: qty 2

## 2016-08-02 MED ORDER — BUTORPHANOL TARTRATE 1 MG/ML IJ SOLN: 1.0000 mg | INTRAMUSCULAR | Status: DC | PRN

## 2016-08-02 MED ORDER — AMMONIA AROMATIC IN INHA
RESPIRATORY_TRACT | Status: AC
  Filled 2016-08-02: qty 10

## 2016-08-02 MED ORDER — OXYTOCIN 10 UNIT/ML IJ SOLN
INTRAMUSCULAR | Status: AC
  Filled 2016-08-02: qty 2

## 2016-08-02 MED ORDER — SOD CITRATE-CITRIC ACID 500-334 MG/5ML PO SOLN: 30.0000 mL | ORAL | Status: DC | PRN

## 2016-08-02 MED ORDER — OXYTOCIN BOLUS FROM INFUSION
500.0000 mL | Freq: Once | INTRAVENOUS | Status: AC
  Administered 2016-08-02: 500 mL via INTRAVENOUS

## 2016-08-02 MED ORDER — ONDANSETRON HCL 4 MG/2ML IJ SOLN: 4.0000 mg | Freq: Four times a day (QID) | INTRAMUSCULAR | Status: DC | PRN

## 2016-08-02 MED ORDER — PENICILLIN G POTASSIUM 5000000 UNITS IJ SOLR
2.5000 10*6.[IU] | INTRAVENOUS | Status: DC
  Filled 2016-08-02 (×7): qty 2.5

## 2016-08-02 MED ORDER — LIDOCAINE HCL (PF) 1 % IJ SOLN
INTRAMUSCULAR | Status: AC
  Filled 2016-08-02: qty 30

## 2016-08-02 MED ORDER — OXYCODONE-ACETAMINOPHEN 5-325 MG PO TABS
2.0000 | ORAL_TABLET | ORAL | Status: DC | PRN
  Administered 2016-08-03 – 2016-08-04 (×3): 2 via ORAL
  Filled 2016-08-02 (×3): qty 2

## 2016-08-02 MED ORDER — OXYTOCIN 40 UNITS IN LACTATED RINGERS INFUSION - SIMPLE MED
2.5000 [IU]/h | INTRAVENOUS | Status: DC
  Administered 2016-08-02: 40 [IU] via INTRAVENOUS

## 2016-08-02 MED ORDER — SODIUM CHLORIDE 0.9 % IV SOLN
INTRAVENOUS | Status: AC
  Filled 2016-08-02: qty 2000

## 2016-08-02 MED ORDER — LACTATED RINGERS IV SOLN: INTRAVENOUS | Status: DC

## 2016-08-02 MED ORDER — ACETAMINOPHEN 325 MG PO TABS: 650.0000 mg | ORAL_TABLET | ORAL | Status: DC | PRN

## 2016-08-02 MED ORDER — LIDOCAINE HCL (PF) 1 % IJ SOLN: 30.0000 mL | INTRAMUSCULAR | Status: DC | PRN

## 2016-08-02 MED ORDER — AMPICILLIN SODIUM 2 G IJ SOLR
2.0000 g | Freq: Once | INTRAMUSCULAR | Status: AC
  Administered 2016-08-02: 2 g via INTRAVENOUS

## 2016-08-02 MED ORDER — LACTATED RINGERS IV SOLN: 500.0000 mL | INTRAVENOUS | Status: DC | PRN

## 2016-08-02 MED ORDER — PENICILLIN G POTASSIUM 5000000 UNITS IJ SOLR
5.0000 10*6.[IU] | Freq: Once | INTRAMUSCULAR | Status: DC
  Filled 2016-08-02: qty 5

## 2016-08-02 MED ORDER — SODIUM CHLORIDE FLUSH 0.9 % IV SOLN
INTRAVENOUS | Status: AC
  Filled 2016-08-02: qty 30

## 2016-08-02 MED ORDER — MISOPROSTOL 200 MCG PO TABS
ORAL_TABLET | ORAL | Status: AC
  Filled 2016-08-02: qty 4

## 2016-08-02 MED ORDER — LACTATED RINGERS IV SOLN
INTRAVENOUS | Status: DC
  Administered 2016-08-02: 125 mL/h via INTRAVENOUS

## 2016-08-02 NOTE — H&P (Signed)
Dominique Ware is a 25 y.o. female G3P1011 at 56+6wks presenting for active labor.   25 y.o. G2P1001 at [redacted]w[redacted]d DATES WERE OFF BY 1 WEEK 1DAY AND NOW NORMAL AFP - DATES CHANGED BY MFM EDD:08/24/16 Sex of baby and name: "It's A BOY!" Dominique Ware  Factors complicating this pregnancy   Tobacco Use in Pregnancy - smoking 0.25PPD - smoking cessation encourage and Nicotine patches ordered on 4/17  Down to 1cig/day   Positive AFP screen for Open Spina Bifida 1 in 299 chance - MFM consulted placed on 03/18/16 - DATES WERE OFF BY 1 WEEK 1DAY AND NOW NORMAL AFP - DATES CHANGED BY MFM  Abnormal 1 hour GTT, unable to finish 3 hour GTT  Hgb A1C: 5.3  Needs Referral to [facility]   A1GDM - Diet controlled, not checking her blood sugars as much because she works night shift and has a hard time keeping up with checking sugars - has been to [facility]  - Offered repeat 3 hour GTT and patient declined  - NSTs/AFI weekly at 36 weeks     On 06/27/16 Growth with [facility]: EFW at the 13th%, repeat growth scheduled for [facility] on 07/25/16  Screening results and needs:  NOB:   MBT O positive Ab screen Negative Pap HIV Negative Hep B/RPR Negative/Non reactive  Rubella Immune VZV Immune  Aneuploidy:   First trimester (Informaseq, NT): too late for testing per [facility] (AFP/tetra): 03/13/16 positive for OSB, Referred to [facility]  28 weeks:   Blood consent: 05/2916  Hct:30.6 Glucola:135, 3 hr GTT scheduled for 06/06/16 unable to complete, HgbA1C 5.3-per TJS complete fasting glucose on 06/13/16-71 Rhogam: n/a  36 weeks:   GBS: POSITIVE G/C:Positive Chlamydia Hgb: 10.7  Last [place]: 05/04/17[facility] for elevated AFP. New EDC AFP redrawn. Fetal anatomy WNL, U/S 06/27/16 Growth : The fetal biometry correlates with established dating, Fetal anatomy was visualized and appears normal.Follow up U/S 07/25/16 3  07/25/16 Follow up growth:Estimated wt. 2402 gm, 5 lb 5oz, 20 % , Cephalic , placental location  fundal, AFI 10.7 mm, adequate growth noted  Immunization:   Tdap at 27-36 weeks - 05/2916  OB History    Gravida Para Term Preterm AB Living   3 2 1 1 1 1    SAB TAB Ectopic Multiple Live Births   1 0 0 0       Past Medical History:  Diagnosis Date  . Anemia   . Gestational diabetes    History reviewed. No pertinent surgical history. Family History: family history is not on file. Social History:  reports that she has been smoking Cigarettes.  She has a 0.50 pack-year smoking history. She has never used smokeless tobacco. She reports that she does not drink alcohol or use drugs.     Maternal Diabetes: Yes:  Diabetes Type:  Diet controlled   Review of Systems  Constitutional: Negative for chills and fever.  Eyes: Negative for blurred vision and double vision.  Respiratory: Negative for shortness of breath.   Cardiovascular: Negative for chest pain and palpitations.  Gastrointestinal: Negative for abdominal pain, constipation, diarrhea, nausea and vomiting.  Genitourinary: Negative for dysuria, flank pain, frequency and urgency.  Neurological: Negative for headaches.  Psychiatric/Behavioral: Negative for depression.   Maternal Medical History:  Reason for admission: Contractions.  Nausea.    Dilation: Lip/rim Effacement (%): 100 Station: +2 Exam by:: Dalbert Garnet, MD  Blood pressure 124/68, pulse 64, temperature 97.6 F (36.4 C), temperature source Oral, resp. rate 18, height  5\' 3"  (1.6 m), weight 132 lb (59.9 kg), last menstrual period 11/29/2015, SpO2 100 %, unknown if currently breastfeeding. Maternal Exam:  Uterine Assessment: Contraction strength is firm.    Physical Exam  Constitutional: She is oriented to person, place, and time. She appears well-developed and well-nourished. No distress.  Eyes: No scleral icterus.  Neck: Normal range of motion. Neck supple.  Cardiovascular: Normal rate.   Respiratory: Effort normal. No respiratory distress.  GI: Soft. She  exhibits no distension. There is no tenderness.  Genitourinary: Vagina normal and uterus normal.  Musculoskeletal: Normal range of motion.  Neurological: She is alert and oriented to person, place, and time.  Skin: Skin is warm and dry.  Psychiatric: She has a normal mood and affect.    Prenatal labs: ABO, Rh: --/--/O POS (09/01 1721) Antibody: NEG (09/01 1721) Rubella:   immune RPR:   non reactive HBsAg:   neg HIV:   neg GBS:   positive  Assessment/Plan: Last US 07/25/16 Follow up growth:Estimated wt. 2402 gm, 5 lb 5oz, 20 % , Cephalic , placental location fundal, AFI 10.7 mm, adequate growth noted  ASSESSMENT AND PLAN:  Admit for active labor Labs pending Epidural when desired Continuous fetal monitoring   1. Fetal Well being  - Fetal Tracing: Cat II - Ultrasound: reviewed, as above - Group B Streptococcus: pos - Presentation: vtx confirmed by leopolds   2. Routine OB: - Prenatal labs reviewed, as above - Rh pos  3. Induction of Labor:  -  Contractions external toco in place  4. Post Partum Planning: - Infant feeding: both  Pt quickly laboring, with hx of fast labors. Will try to get abx in   Dominique Ware, Toma CopierBETHANY 08/02/2016, 6:54 PM

## 2016-08-02 NOTE — OB Triage Note (Signed)
Pt. Sent from office visit for NST and labor check. U/S and toco applied; FHR 140s, abd. Soft. Pt. Denies gush of fluid nor vaginal bleeding.  Positive for fetal movement.

## 2016-08-03 LAB — CBC
HCT: 31.1 % — ABNORMAL LOW (ref 35.0–47.0)
HEMOGLOBIN: 10.7 g/dL — AB (ref 12.0–16.0)
MCH: 29 pg (ref 26.0–34.0)
MCHC: 34.3 g/dL (ref 32.0–36.0)
MCV: 84.6 fL (ref 80.0–100.0)
PLATELETS: 231 10*3/uL (ref 150–440)
RBC: 3.68 MIL/uL — AB (ref 3.80–5.20)
RDW: 13.7 % (ref 11.5–14.5)
WBC: 8.4 10*3/uL (ref 3.6–11.0)

## 2016-08-03 LAB — RPR: RPR Ser Ql: NONREACTIVE

## 2016-08-03 MED ORDER — DIBUCAINE 1 % RE OINT: 1.0000 "application " | TOPICAL_OINTMENT | RECTAL | Status: DC | PRN

## 2016-08-03 MED ORDER — IBUPROFEN 600 MG PO TABS
600.0000 mg | ORAL_TABLET | Freq: Four times a day (QID) | ORAL | Status: DC
  Administered 2016-08-04 (×3): 600 mg via ORAL
  Filled 2016-08-03 (×3): qty 1

## 2016-08-03 MED ORDER — SIMETHICONE 80 MG PO CHEW: 80.0000 mg | CHEWABLE_TABLET | ORAL | Status: DC | PRN

## 2016-08-03 MED ORDER — ONDANSETRON HCL 4 MG/2ML IJ SOLN: 4.0000 mg | INTRAMUSCULAR | Status: DC | PRN

## 2016-08-03 MED ORDER — TETANUS-DIPHTH-ACELL PERTUSSIS 5-2.5-18.5 LF-MCG/0.5 IM SUSP: 0.5000 mL | Freq: Once | INTRAMUSCULAR | Status: DC

## 2016-08-03 MED ORDER — BENZOCAINE-MENTHOL 20-0.5 % EX AERO: 1.0000 "application " | INHALATION_SPRAY | CUTANEOUS | Status: DC | PRN

## 2016-08-03 MED ORDER — DIPHENHYDRAMINE HCL 25 MG PO CAPS: 25.0000 mg | ORAL_CAPSULE | Freq: Four times a day (QID) | ORAL | Status: DC | PRN

## 2016-08-03 MED ORDER — COCONUT OIL OIL: 1.0000 "application " | TOPICAL_OIL | Status: DC | PRN

## 2016-08-03 MED ORDER — PRENATAL MULTIVITAMIN CH
1.0000 | ORAL_TABLET | Freq: Every day | ORAL | Status: DC
  Administered 2016-08-03 – 2016-08-04 (×2): 1 via ORAL
  Filled 2016-08-03 (×2): qty 1

## 2016-08-03 MED ORDER — ONDANSETRON HCL 4 MG PO TABS: 4.0000 mg | ORAL_TABLET | ORAL | Status: DC | PRN

## 2016-08-03 MED ORDER — WITCH HAZEL-GLYCERIN EX PADS: 1.0000 "application " | MEDICATED_PAD | CUTANEOUS | Status: DC | PRN

## 2016-08-03 MED ORDER — ZOLPIDEM TARTRATE 5 MG PO TABS: 5.0000 mg | ORAL_TABLET | Freq: Every evening | ORAL | Status: DC | PRN

## 2016-08-03 MED ORDER — LACTATED RINGERS IV SOLN
INTRAVENOUS | Status: DC
  Administered 2016-08-03: 04:00:00 via INTRAVENOUS

## 2016-08-03 MED ORDER — ACETAMINOPHEN 325 MG PO TABS: 650.0000 mg | ORAL_TABLET | ORAL | Status: DC | PRN

## 2016-08-03 MED ORDER — SENNOSIDES-DOCUSATE SODIUM 8.6-50 MG PO TABS
2.0000 | ORAL_TABLET | ORAL | Status: DC
  Filled 2016-08-03: qty 2

## 2016-08-03 MED ORDER — IBUPROFEN 600 MG PO TABS
600.0000 mg | ORAL_TABLET | Freq: Four times a day (QID) | ORAL | Status: DC
  Administered 2016-08-03 (×4): 600 mg via ORAL
  Filled 2016-08-03 (×4): qty 1

## 2016-08-03 NOTE — Progress Notes (Signed)
Post Partum Day 1 Subjective: no complaints, up ad lib, voiding and tolerating PO  Objective: Blood pressure (!) 96/59, pulse (!) 56, temperature 98.7 F (37.1 C), temperature source Oral, resp. rate 18, height 5\' 3"  (1.6 m), weight 132 lb (59.9 kg), last menstrual period 11/29/2015, SpO2 100 %, unknown if currently breastfeeding.  Physical Exam:  General: alert and cooperative Lochia: appropriate Uterine Fundus: firm DVT Evaluation: No evidence of DVT seen on physical exam.   Recent Labs  08/02/16 1723 08/03/16 0551  HGB 12.0 10.7*  HCT 35.8 31.1*    Assessment/Plan: Plan for discharge tomorrow and Lactation consult   LOS: 1 day   Dominique Ware 08/03/2016, 10:42 AM

## 2016-08-04 MED ORDER — MEDROXYPROGESTERONE ACETATE 150 MG/ML IM SUSP
150.0000 mg | INTRAMUSCULAR | Status: DC
  Administered 2016-08-04: 150 mg via INTRAMUSCULAR
  Filled 2016-08-04: qty 1

## 2016-08-04 MED ORDER — ASCORBIC ACID 250 MG PO TABS: 250.0000 mg | ORAL_TABLET | Freq: Two times a day (BID) | ORAL | 3 refills | Status: AC

## 2016-08-04 MED ORDER — MEDROXYPROGESTERONE ACETATE 150 MG/ML IM SUSP: 150.0000 mg | INTRAMUSCULAR | 3 refills | Status: DC

## 2016-08-04 MED ORDER — IBUPROFEN 800 MG PO TABS: 800.0000 mg | ORAL_TABLET | Freq: Three times a day (TID) | ORAL | 0 refills | Status: AC | PRN

## 2016-08-04 MED ORDER — DOCUSATE SODIUM 100 MG PO CAPS: 100.0000 mg | ORAL_CAPSULE | Freq: Every day | ORAL | 3 refills | Status: DC | PRN

## 2016-08-04 NOTE — Discharge Summary (Signed)
Obstetric Discharge Summary Reason for Admission: onset of labor Prenatal Procedures: NST and ultrasound Intrapartum Procedures: spontaneous vaginal delivery Postpartum Procedures: none Complications-Operative and Postpartum: none Hemoglobin  Date Value Ref Range Status  08/03/2016 10.7 (L) 12.0 - 16.0 g/dL Final   HCT  Date Value Ref Range Status  08/03/2016 31.1 (L) 35.0 - 47.0 % Final    Physical Exam:  General: alert, cooperative and appears stated age 45Lochia: appropriate Uterine Fundus: firm DVT Evaluation: No evidence of DVT seen on physical exam.  Discharge Diagnoses: Term Pregnancy-delivered  Discharge Information: Date: 08/04/2016 Activity: pelvic rest Diet: routine Medications: Ibuprofen, Colace and Iron Condition: stable Instructions: refer to practice specific booklet Discharge to: home Follow-up Information    Christeen DouglasBEASLEY, Jayleen Scaglione, MD. Call in 6 week(s).   Specialty:  Obstetrics and Gynecology Why:  [facility] to schedule postpartum appointment in 6 weeks. Contact information: [address] [place] [id number] [phone]           Newborn Data: Live born female "[name]" Birth Weight: 5 lb 10.3 oz (2560 g) APGAR: 8, 9  Home with mother.  Christeen DouglasBEASLEY, Donda Friedli 08/04/2016, 11:22 AM

## 2016-08-04 NOTE — Progress Notes (Signed)
Discharge instructions provided.  Pt verbalizes understanding of all instructions and follow-up care. Pt discharged to home with infant at 1640 on 08/04/16 via wheelchair by RN. Reynold BowenSusan Paisley Rettie Laird, RN 08/04/2016 4:44 PM

## 2016-08-04 NOTE — Discharge Instructions (Signed)
Call your doctor for increased pain or vaginal bleeding, temperature above 100.4, depression, or concerns.  No strenuous activity or heavy lifting for 6 weeks.  No intercourse, tampons, douching, or enemas for 6 weeks.  No tub baths-showers only.  No driving for 2 weeks or while taking pain medications.  Continue prenatal vitamin and iron.    Care After Vaginal Delivery Congratulations on your new baby!!  Refer to this sheet in the next few weeks. These discharge instructions provide you with information on caring for yourself after delivery. Your caregiver may also give you specific instructions. Your treatment has been planned according to the most current medical practices available, but problems sometimes occur. Call your caregiver if you have any problems or questions after you go home.  HOME CARE INSTRUCTIONS  Take over-the-counter or prescription medicines only as directed by your caregiver or pharmacist.  Do not drink alcohol, especially if you are breastfeeding or taking medicine to relieve pain.  Do not chew or smoke tobacco.  Do not use illegal drugs.  Continue to use good perineal care. Good perineal care includes:  Wiping your perineum from front to back.  Keeping your perineum clean.  Do not use tampons or douche until your caregiver says it is okay.  Shower, wash your hair, and take tub baths as directed by your caregiver.  Wear a well-fitting bra that provides breast support.  Eat healthy foods.  Drink enough fluids to keep your urine clear or pale yellow.  Eat high-fiber foods such as whole grain cereals and breads, brown rice, beans, and fresh fruits and vegetables every day. These foods may help prevent or relieve constipation.  Follow your caregiver's recommendations regarding resumption of activities such as climbing stairs, driving, lifting, exercising, or traveling. Specifically, no driving for two weeks, so that you are comfortable reacting quickly in an  emergency.  Talk to your caregiver about resuming sexual activities. Resumption of sexual activities is dependent upon your risk of infection, your rate of healing, and your comfort and desire to resume sexual activity. Usually we recommend waiting about six weeks, or until your bleeding stops and you are interested in sex.  Try to have someone help you with your household activities and your newborn for at least a few days after you leave the hospital. Even longer is better.  Rest as much as possible. Try to rest or take a nap when your newborn is sleeping. Sleep deprivation can be very hard after delivery.  Increase your activities gradually.  Keep all of your scheduled postpartum appointments. It is very important to keep your scheduled follow-up appointments. At these appointments, your caregiver will be checking to make sure that you are healing physically and emotionally.  SEEK MEDICAL CARE IF:   You are passing large clots from your vagina.   You have a foul smelling discharge from your vagina.  You have trouble urinating.  You are urinating frequently.  You have pain when you urinate.  You have a change in your bowel movements.  You have increasing redness, pain, or swelling near your vaginal incision (episiotomy) or vaginal tear.  You have pus draining from your episiotomy or vaginal tear.  Your episiotomy or vaginal tear is separating.  You have painful, hard, or reddened breasts.  You have a severe headache.  You have blurred vision or see spots.  You feel sad or depressed.  You have thoughts of hurting yourself or your newborn.  You have questions about your care, the care  of your newborn, or medicines.  You are dizzy or light-headed.  You have a rash.  You have nausea or vomiting.  You were breastfeeding and have not had a menstrual period within 12 weeks after you stopped breastfeeding.  You are not breastfeeding and have not had a menstrual period by  the 12th week after delivery.  You have a fever.  SEEK IMMEDIATE MEDICAL CARE IF:   You have persistent pain.  You have chest pain.  You have shortness of breath.  You faint.  You have leg pain.  You have stomach pain.  Your vaginal bleeding saturates two or more sanitary pads in 1 hour.  MAKE SURE YOU:   Understand these instructions.  Will get help right away if you are not doing well or get worse.   Document Released: 11/15/2000 Document Revised: 04/04/2014 Document Reviewed: 07/15/2012  [facility] Patient Information 2015 [place], [place]. This information is not intended to replace advice given to you by your health care provider. Make sure you discuss any questions you have with your health care provider.  Call your doctor for increased pain or vaginal bleeding, temperature above 100.4, depression, or concerns.  No strenuous activity or heavy lifting for 6 weeks.  No intercourse, tampons, douching, or enemas for 6 weeks.  No tub baths-showers only.  No driving for 2 weeks or while taking pain medications.  Continue prenatal vitamin and iron.

## 2016-08-04 NOTE — Progress Notes (Signed)
Prenatal records indicate that pt received TDaP vaccine on 05/30/16. Reynold BowenSusan Paisley Donatello Kleve, RN 08/04/2016 1:05 PM

## 2016-08-07 LAB — SURGICAL PATHOLOGY

## 2016-12-24 LAB — HM HIV SCREENING LAB: HM HIV Screening: NEGATIVE

## 2017-08-01 IMAGING — US US MFM OB FOLLOW-UP
1 series · 14 of 28 positions shown · non-contrast
Comparison: none

[Series 1: us mfm ob follow-up · 0.17mm/px · 14 of 52 slices shown]
[im 2/52]
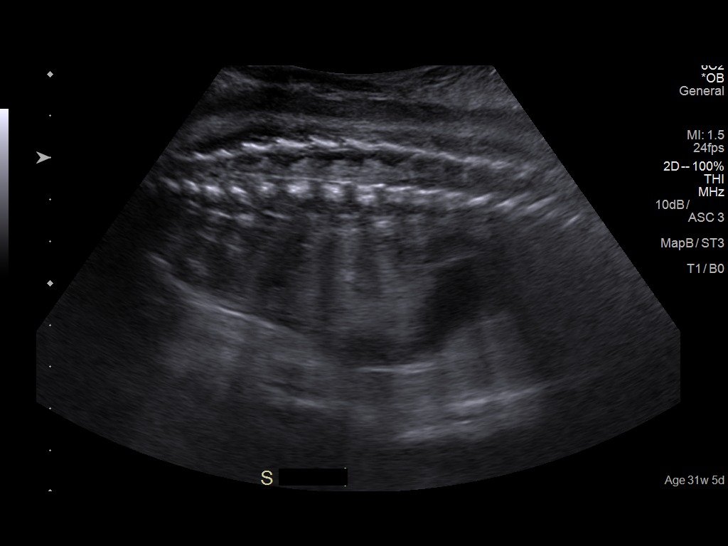
[im 6/52]
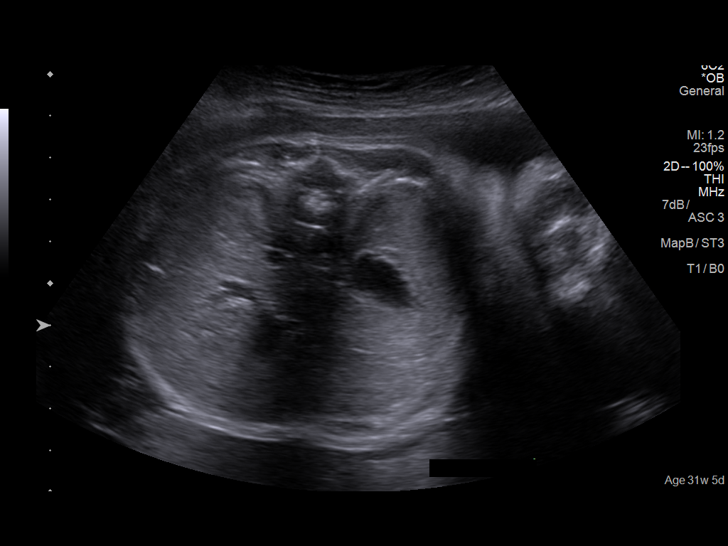
[im 10/52]
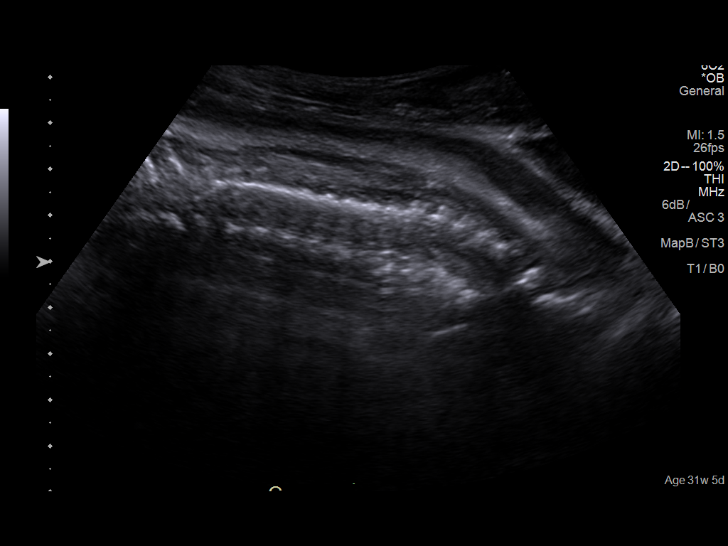
[im 14/52]
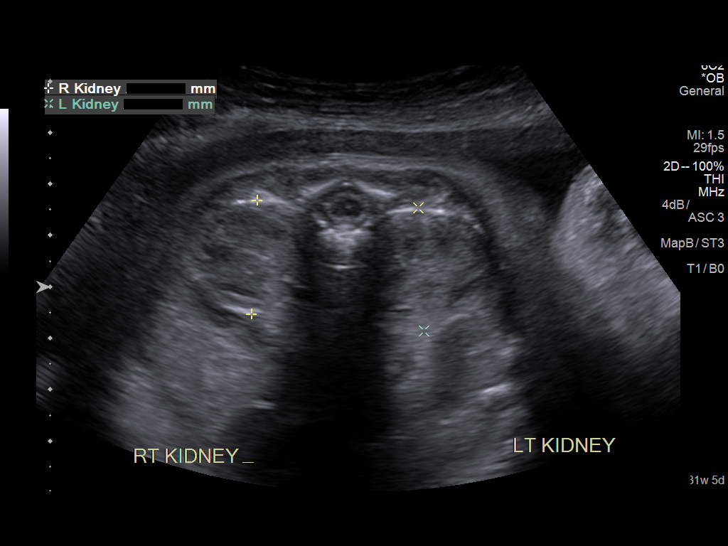
[im 18/52]
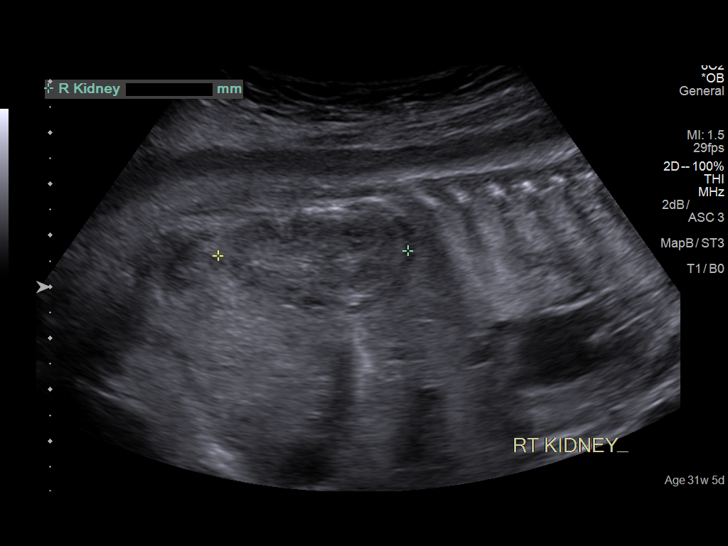
[im 21/52]
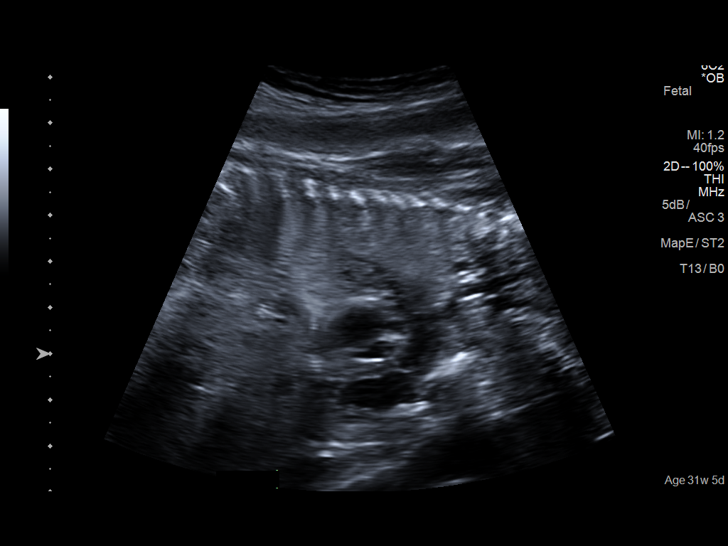
[im 25/52]
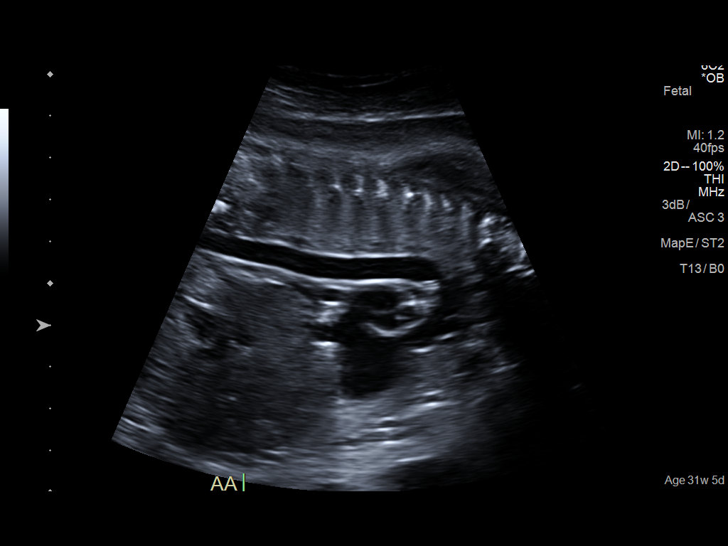
[im 29/52]
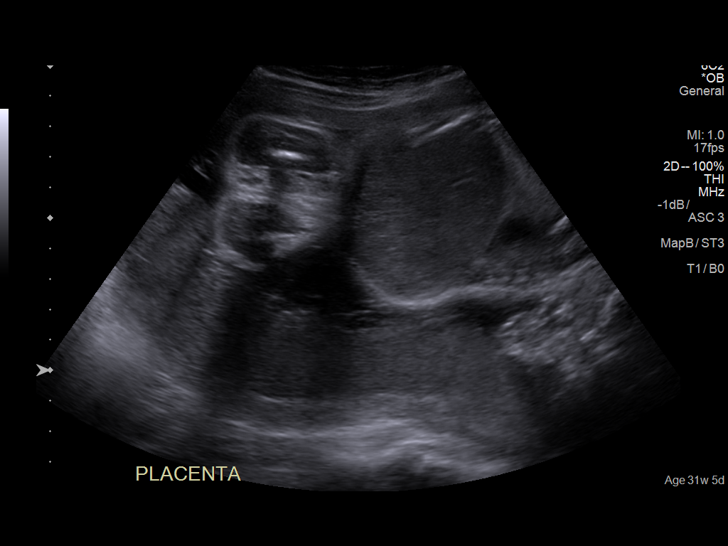
[im 33/52]
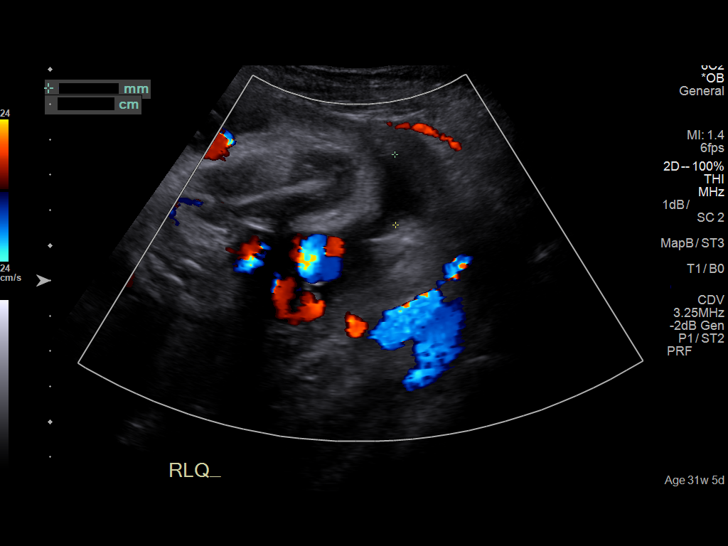
[im 36/52]
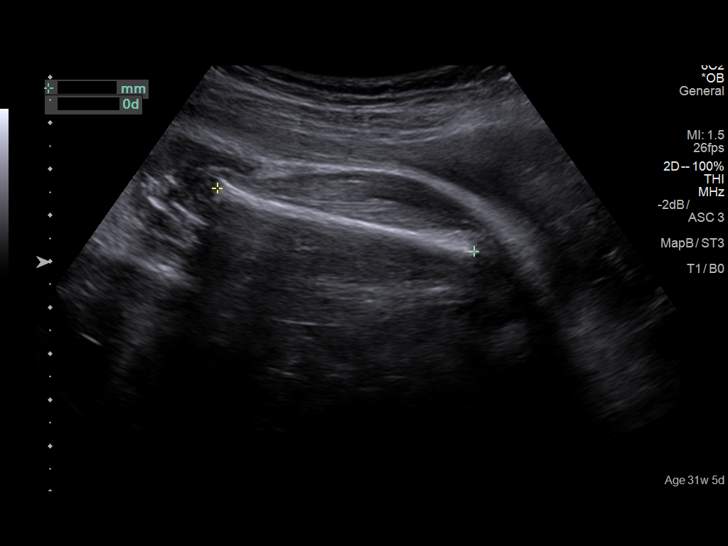
[im 40/52]
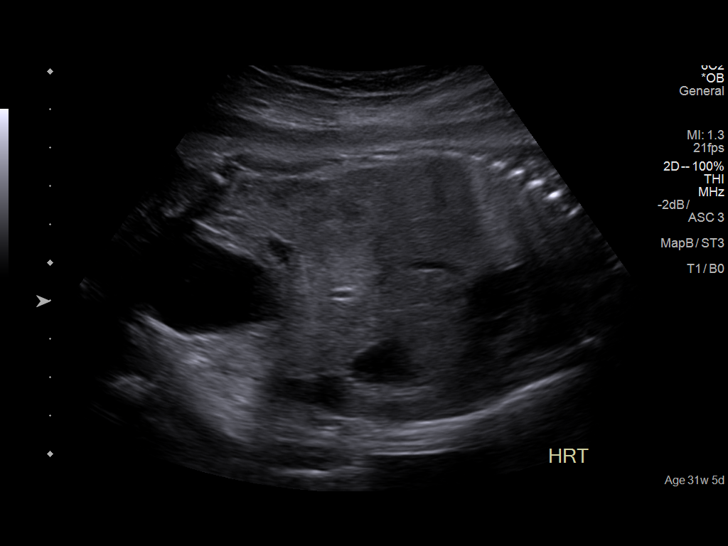
[im 44/52]
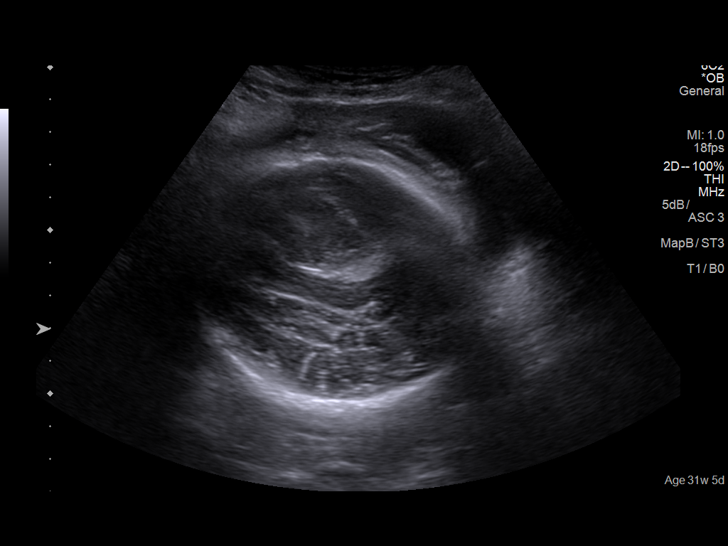
[im 48/52]
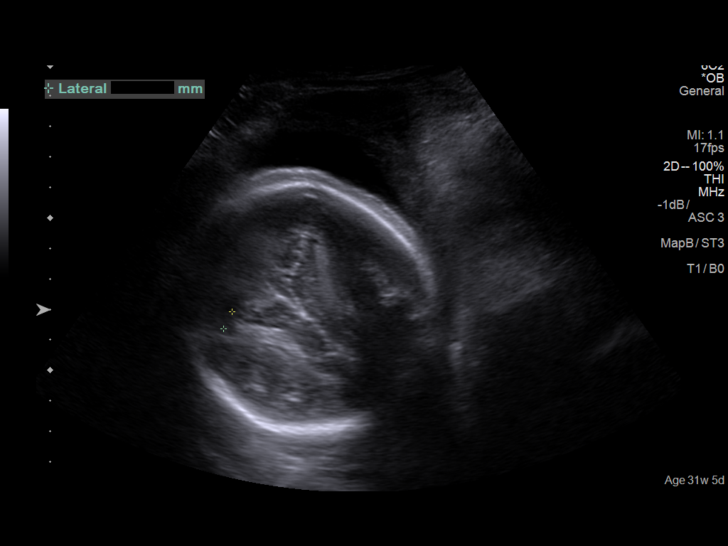
[im 52/52]
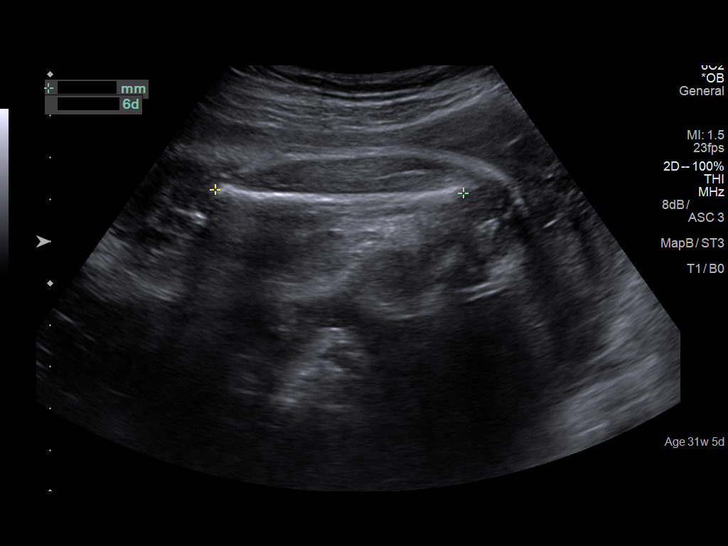

[14 of 28 positions shown; findings below may reference images not displayed]

Canned report from images found in remote index.

Refer to host system for actual result text.

## 2018-04-21 LAB — HM PAP SMEAR: HM Pap smear: NEGATIVE

## 2018-05-12 ENCOUNTER — Emergency Department: Payer: 59

## 2018-05-12 ENCOUNTER — Other Ambulatory Visit: Payer: Self-pay

## 2018-05-12 ENCOUNTER — Emergency Department
Admission: EM | Admit: 2018-05-12 | Discharge: 2018-05-12 | Disposition: A | Discharge status: Home / Self Care | Payer: 59 | Attending: Emergency Medicine | Admitting: Emergency Medicine

## 2018-05-12 DIAGNOSIS — T07XXXA Unspecified multiple injuries, initial encounter: Secondary | ICD-10-CM

## 2018-05-12 DIAGNOSIS — Y998 Other external cause status: Secondary | ICD-10-CM | POA: Diagnosis not present

## 2018-05-12 DIAGNOSIS — S8001XA Contusion of right knee, initial encounter: Secondary | ICD-10-CM | POA: Insufficient documentation

## 2018-05-12 DIAGNOSIS — Y929 Unspecified place or not applicable: Secondary | ICD-10-CM | POA: Diagnosis not present

## 2018-05-12 DIAGNOSIS — W2210XA Striking against or struck by unspecified automobile airbag, initial encounter: Secondary | ICD-10-CM | POA: Diagnosis not present

## 2018-05-12 DIAGNOSIS — Z79899 Other long term (current) drug therapy: Secondary | ICD-10-CM | POA: Diagnosis not present

## 2018-05-12 DIAGNOSIS — M7918 Myalgia, other site: Secondary | ICD-10-CM | POA: Diagnosis not present

## 2018-05-12 DIAGNOSIS — Y9389 Activity, other specified: Secondary | ICD-10-CM | POA: Insufficient documentation

## 2018-05-12 DIAGNOSIS — F17210 Nicotine dependence, cigarettes, uncomplicated: Secondary | ICD-10-CM | POA: Insufficient documentation

## 2018-05-12 DIAGNOSIS — S8991XA Unspecified injury of right lower leg, initial encounter: Secondary | ICD-10-CM | POA: Diagnosis present

## 2018-05-12 MED ORDER — CYCLOBENZAPRINE HCL 5 MG PO TABS: 5.0000 mg | ORAL_TABLET | Freq: Three times a day (TID) | ORAL | 0 refills | Status: DC | PRN

## 2018-05-12 MED ORDER — KETOROLAC TROMETHAMINE 10 MG PO TABS: 10.0000 mg | ORAL_TABLET | Freq: Three times a day (TID) | ORAL | 0 refills | Status: DC

## 2018-05-12 MED ORDER — BACITRACIN ZINC 500 UNIT/GM EX OINT
TOPICAL_OINTMENT | CUTANEOUS | Status: AC
  Filled 2018-05-12: qty 0.9

## 2018-05-12 MED ORDER — BACITRACIN ZINC 500 UNIT/GM EX OINT: TOPICAL_OINTMENT | Freq: Once | CUTANEOUS | Status: DC

## 2018-05-12 MED ORDER — ORPHENADRINE CITRATE 30 MG/ML IJ SOLN
60.0000 mg | INTRAMUSCULAR | Status: AC
  Administered 2018-05-12: 60 mg via INTRAMUSCULAR
  Filled 2018-05-12: qty 2

## 2018-05-12 MED ORDER — KETOROLAC TROMETHAMINE 30 MG/ML IJ SOLN
30.0000 mg | Freq: Once | INTRAMUSCULAR | Status: AC
  Administered 2018-05-12: 30 mg via INTRAMUSCULAR
  Filled 2018-05-12: qty 1
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
## 2020-01-19 DIAGNOSIS — Z8742 Personal history of other diseases of the female genital tract: Secondary | ICD-10-CM

## 2020-01-20 ENCOUNTER — Ambulatory Visit: Payer: Self-pay

## 2020-09-09 ENCOUNTER — Emergency Department
Admission: EM | Admit: 2020-09-09 | Discharge: 2020-09-09 | Disposition: A | Discharge status: Home / Self Care | Payer: Medicaid Other | Attending: Emergency Medicine | Admitting: Emergency Medicine

## 2020-09-09 ENCOUNTER — Emergency Department: Payer: Medicaid Other

## 2020-09-09 ENCOUNTER — Other Ambulatory Visit: Payer: Self-pay

## 2020-09-09 DIAGNOSIS — N3000 Acute cystitis without hematuria: Secondary | ICD-10-CM | POA: Diagnosis not present

## 2020-09-09 DIAGNOSIS — Z20822 Contact with and (suspected) exposure to covid-19: Secondary | ICD-10-CM | POA: Diagnosis not present

## 2020-09-09 DIAGNOSIS — F17210 Nicotine dependence, cigarettes, uncomplicated: Secondary | ICD-10-CM | POA: Insufficient documentation

## 2020-09-09 DIAGNOSIS — J020 Streptococcal pharyngitis: Secondary | ICD-10-CM | POA: Diagnosis not present

## 2020-09-09 DIAGNOSIS — R519 Headache, unspecified: Secondary | ICD-10-CM | POA: Diagnosis present

## 2020-09-09 LAB — URINALYSIS, COMPLETE (UACMP) WITH MICROSCOPIC
Bilirubin Urine: NEGATIVE
Glucose, UA: NEGATIVE mg/dL
Ketones, ur: 20 mg/dL — AB
Nitrite: NEGATIVE
Protein, ur: 30 mg/dL — AB
Specific Gravity, Urine: 1.028 (ref 1.005–1.030)
pH: 5 (ref 5.0–8.0)

## 2020-09-09 LAB — POCT PREGNANCY, URINE: Preg Test, Ur: NEGATIVE

## 2020-09-09 LAB — RESPIRATORY PANEL BY RT PCR (FLU A&B, COVID)
Influenza A by PCR: NEGATIVE
Influenza B by PCR: NEGATIVE
SARS Coronavirus 2 by RT PCR: NEGATIVE

## 2020-09-09 LAB — GROUP A STREP BY PCR: Group A Strep by PCR: DETECTED — AB

## 2020-09-09 MED ORDER — PENICILLIN G BENZATHINE 1200000 UNIT/2ML IM SUSP
1.2000 10*6.[IU] | Freq: Once | INTRAMUSCULAR | Status: AC
  Administered 2020-09-09: 1.2 10*6.[IU] via INTRAMUSCULAR
  Filled 2020-09-09: qty 2

## 2020-09-09 MED ORDER — ALBUTEROL SULFATE HFA 108 (90 BASE) MCG/ACT IN AERS: 2.0000 | INHALATION_SPRAY | Freq: Four times a day (QID) | RESPIRATORY_TRACT | 2 refills | Status: DC | PRN

## 2020-09-09 MED ORDER — CEPHALEXIN 500 MG PO CAPS: 500.0000 mg | ORAL_CAPSULE | Freq: Four times a day (QID) | ORAL | 0 refills | Status: AC

## 2020-09-09 MED ORDER — DEXAMETHASONE SODIUM PHOSPHATE 10 MG/ML IJ SOLN
8.0000 mg | Freq: Once | INTRAMUSCULAR | Status: AC
  Administered 2020-09-09: 8 mg via INTRAMUSCULAR
  Filled 2020-09-09: qty 1

## 2020-09-09 NOTE — ED Notes (Signed)
Pt c/o sore throat that started this morning, c/o severe generalized body aches that started yesterday. Pt ambulatory from waiting room to triage room. Pt also c/o feeling like she is "spitting a lot". Able to maintain own secretions without difficulty at this time, able to swallow small sips of water without difficulty at this time.

## 2020-09-09 NOTE — ED Triage Notes (Signed)
Pt to ED c/o sore throat that started 09/07/20. C/o body aches, HA that started 10/8 and could not sleep last night. Denies fever.

## 2020-09-09 NOTE — ED Provider Notes (Signed)
[facility] Emergency Department Provider Note  ___________________________________________   First MD Initiated Contact with Patient 09/09/20 [phone]     (approximate)  I have reviewed the triage vital signs and the nursing notes.   HISTORY  Chief Complaint Sore Throat  HPI Dominique Ware is a 29 y.o. female who reports to the emergency department for evaluation of significant sore throat that began 2 days ago.  She has associated body aches and what she describes as a very bad headache that started yesterday.  She states that her headache and sore throat are so bad that she cannot sleep.  Her throat pain a 10/10.  She does not believe that she has had a fever.  She states that her throat feels like glass and she is choosing to spit her saliva instead of swallowing it when she has the opportunity.  She is able to swallow small sips of water when asked.  She states that she works at [employer] and there have been some positive Covid cases she is unsure if this is what is going on with her.  She denies nasal congestion, cough, abdominal pain, nausea or vomiting, diarrhea.  Of note, she specifically denies dysuria but has stated that it has been hard to be hydrated given her sore throat.  She does state that she has had some occasional shortness of breath but feels this is intermittent but she does admit to smoking approximately 1 pack every 2 days.         Past Medical History:  Diagnosis Date  . Anemia     Patient Active Problem List   Diagnosis Date Noted  . Supervision of other high-risk pregnancy 08/02/2016  . Elevated AFP 04/04/2016  . History of abnormal cervical Pap smear 02/20/2011    History reviewed. No pertinent surgical history.  Prior to Admission medications   Medication Sig Start Date End Date Taking? Authorizing Provider  albuterol (VENTOLIN HFA) 108 (90 Base) MCG/ACT inhaler Inhale 2 puffs into the lungs every 6 (six) hours as needed for  wheezing or shortness of breath. 09/09/20   Joni Reining, PA-C  cephALEXin (KEFLEX) 500 MG capsule Take 1 capsule (500 mg total) by mouth 4 (four) times daily for 10 days. 09/09/20 09/19/20  Joni Reining, PA-C  cyclobenzaprine (FLEXERIL) 5 MG tablet Take 1 tablet (5 mg total) by mouth 3 (three) times daily as needed for muscle spasms. 05/12/18   Menshew, Charlesetta Ivory, PA-C  docusate sodium (COLACE) 100 MG capsule Take 1 capsule (100 mg total) by mouth daily as needed for mild constipation. 08/04/16   Christeen Douglas, MD  ferrous sulfate 325 (65 FE) MG tablet Take 1 tablet by mouth daily with breakfast.    [provider]  ketorolac (TORADOL) 10 MG tablet Take 1 tablet (10 mg total) by mouth every 8 (eight) hours. 05/12/18   Menshew, Charlesetta Ivory, PA-C  medroxyPROGESTERone (DEPO-PROVERA) 150 MG/ML injection Inject 1 mL (150 mg total) into the muscle every 3 (three) months. First dose 08/04/16 08/04/16 05/02/17  Christeen Douglas, MD  metroNIDAZOLE (METROGEL) 0.75 % vaginal gel Place 1 Applicatorful vaginally daily. 06/13/16   [provider]  Prenatal Vit-Fe Fumarate-FA (PRENATAL MULTIVITAMIN) TABS tablet Take 1 tablet by mouth daily at 12 noon.    [provider]    Allergies Patient has no known allergies.  Family History  Problem Relation Age of Onset  . Heart disease Mother   . Heart attack Mother   .  Hypertension Mother   . Cancer Maternal Grandfather     Social History Social History   Tobacco Use  . Smoking status: Current Every Day Smoker    Packs/day: 0.50    Years: 5.00    Pack years: 2.50    Types: Cigarettes  . Smokeless tobacco: Never Used  . Tobacco comment: 1-2 cigarettes daily  Substance Use Topics  . Alcohol use: No    Comment: on occassion - not since pregnancy  . Drug use: No    Review of Systems  Constitutional: No fever/chills Eyes: No visual changes. ENT: + sore throat. Cardiovascular: Denies chest pain. Respiratory: +  shortness of breath. Gastrointestinal: No abdominal pain.  No nausea, no vomiting.  No diarrhea.  No constipation. Genitourinary: Negative for dysuria. Musculoskeletal: Negative for back pain. Skin: Negative for rash. Neurological: + headaches, negative for focal weakness or numbness. ____________________________________________   PHYSICAL EXAM:  VITAL SIGNS: ED Triage Vitals  Enc Vitals Group     BP 09/09/20 0913 109/64     Pulse Rate 09/09/20 0913 99     Resp 09/09/20 0913 18     Temp 09/09/20 0913 98.7 F (37.1 C)     Temp Source 09/09/20 0913 Oral     SpO2 09/09/20 0913 100 %     Weight 09/09/20 0914 145 lb (65.8 kg)     Height 09/09/20 0914 5\' 2"  (1.575 m)     Head Circumference --      Peak Flow --      Pain Score 09/09/20 0914 10     Pain Loc --      Pain Edu? --      Excl. in GC? --     Constitutional: Alert and oriented. Well appearing and in no acute distress. Eyes: Conjunctivae are normal. PERRL. EOMI. Head: Atraumatic. Nose: No congestion/rhinnorhea. Mouth/Throat: Mucous membranes are moist.  Oropharynx quite erythematous with 3+ tonsils without any noted white exudate.  Uvula appears mildly swollen but is midline. Neck: No stridor.   Lymphatic: No cervical lymphadenopathy Cardiovascular: Normal rate, regular rhythm. Grossly normal heart sounds.  Good peripheral circulation. Respiratory: Normal respiratory effort.  No retractions. Lungs with scattered external wheezes heard throughout both lung fields. Gastrointestinal: Soft and nontender. No distention. No abdominal bruits. No CVA tenderness. Musculoskeletal: No lower extremity tenderness nor edema.  No joint effusions. Neurologic:  Normal speech and language. No gross focal neurologic deficits are appreciated. No gait instability. Skin:  Skin is warm, dry and intact. No rash noted. Psychiatric: Mood and affect are normal. Speech and behavior are normal.  ____________________________________________    LABS (all labs ordered are listed, but only abnormal results are displayed)  Labs Reviewed  GROUP A STREP BY PCR - Abnormal; Notable for the following components:      Result Value   Group A Strep by PCR DETECTED (*)    All other components within normal limits  URINALYSIS, COMPLETE (UACMP) WITH MICROSCOPIC - Abnormal; Notable for the following components:   Color, Urine YELLOW (*)    APPearance CLOUDY (*)    Hgb urine dipstick MODERATE (*)    Ketones, ur 20 (*)    Protein, ur 30 (*)    Leukocytes,Ua MODERATE (*)    Bacteria, UA FEW (*)    All other components within normal limits  RESPIRATORY PANEL BY RT PCR (FLU A&B, COVID)  URINE CULTURE  POCT PREGNANCY, URINE   ____________________________________________  RADIOLOGY I, 11/09/20, personally viewed and evaluated these images (  plain radiographs) as part of my medical decision making, as well as reviewing the written report by the radiologist.  ED provider interpretation: No active pneumonia.  Official radiology report(s): DG Chest Portable 1 View  Result Date: 09/09/2020 CLINICAL DATA:  Patient with sore throat.  Generalized body aches. EXAM: PORTABLE CHEST 1 VIEW COMPARISON:  Chest radiograph 01/16/2008. FINDINGS: Normal cardiac and mediastinal contours. No large area pulmonary consolidation. No pleural effusion or pneumothorax. Osseous structures unremarkable. IMPRESSION: No active disease. Electronically Signed   By: Annia Beltrew  Davis M.D.   On: 09/09/2020 11:33    ____________________________________________   INITIAL IMPRESSION / ASSESSMENT AND PLAN / ED COURSE  As part of my medical decision making, I reviewed the following data within the electronic MEDICAL RECORD NUMBER Nursing notes reviewed and incorporated, Labs reviewed  and Radiograph reviewed         Michael LitterCamilla Ware is a 29 year old female who presents emergency department for evaluation of severe sore throat, headache and intermittent shortness of breath for  the last 2 days.  This has not been associated with a fever.  She works at [employer] and has had sick contacts.  Evaluation in our department included urinalysis, strep swab, respiratory panel as well as chest x-ray.  Her chest x-ray and respiratory panel are reassuring for no active pneumonia or Covid/flu.  Her urinalysis demonstrated some moderate hemoglobin as well as moderate leukocytes and bacteria.  In addition, her strep swab was positive for group A strep.  Given that she needs treatment for strep as well as suspected lower UTI, the patient opted for injected penicillin for her strep.  Also provide the patient Decadron given the amount of pharynx swelling to provide relief.  We will place the patient on outpatient course of Keflex for suspected UTI.  In addition, she does have some scattered external wheezes likely to be reactive from her smoking versus undiagnosed asthma.  We will provide the patient an inhaler and ask her to follow-up closely with the primary care provider.  The patient is amenable with this plan and work note was provided.      ____________________________________________   FINAL CLINICAL IMPRESSION(S) / ED DIAGNOSES  Final diagnoses:  Strep throat  Acute cystitis without hematuria     ED Discharge Orders         Ordered    cephALEXin (KEFLEX) 500 MG capsule  4 times daily        09/09/20 1114    albuterol (VENTOLIN HFA) 108 (90 Base) MCG/ACT inhaler  Every 6 hours PRN        09/09/20 1114          *Please note:  Dominique Ware was evaluated in Emergency Department on 09/09/2020 for the symptoms described in the history of present illness. She was evaluated in the context of the global COVID-19 pandemic, which necessitated consideration that the patient might be at risk for infection with the SARS-CoV-2 virus that causes COVID-19. Institutional protocols and algorithms that pertain to the evaluation of patients at risk for COVID-19 are in a state of rapid change  based on information released by regulatory bodies including the CDC and federal and state organizations. These policies and algorithms were followed during the patient's care in the ED.  Some ED evaluations and interventions may be delayed as a result of limited staffing during and the pandemic.*   Note:  This document was prepared using Dragon voice recognition software and may include unintentional dictation errors.    Junita Pushodgers,  Ruben Gottron, [place] 09/09/20 1334    Sharyn Creamer, MD 09/09/20 1531

## 2020-09-10 LAB — URINE CULTURE

## 2020-09-23 ENCOUNTER — Emergency Department
Admission: EM | Admit: 2020-09-23 | Discharge: 2020-09-23 | Disposition: A | Discharge status: Home / Self Care | Payer: Medicaid Other | Attending: Emergency Medicine | Admitting: Emergency Medicine

## 2020-09-23 DIAGNOSIS — K2900 Acute gastritis without bleeding: Secondary | ICD-10-CM | POA: Diagnosis not present

## 2020-09-23 DIAGNOSIS — F17210 Nicotine dependence, cigarettes, uncomplicated: Secondary | ICD-10-CM | POA: Insufficient documentation

## 2020-09-23 DIAGNOSIS — R1013 Epigastric pain: Secondary | ICD-10-CM | POA: Diagnosis present

## 2020-09-23 DIAGNOSIS — R197 Diarrhea, unspecified: Secondary | ICD-10-CM | POA: Insufficient documentation

## 2020-09-23 LAB — COMPREHENSIVE METABOLIC PANEL
ALT: 19 U/L (ref 0–44)
AST: 27 U/L (ref 15–41)
Albumin: 4 g/dL (ref 3.5–5.0)
Alkaline Phosphatase: 75 U/L (ref 38–126)
Anion gap: 12 (ref 5–15)
BUN: 13 mg/dL (ref 6–20)
CO2: 22 mmol/L (ref 22–32)
Calcium: 8.8 mg/dL — ABNORMAL LOW (ref 8.9–10.3)
Chloride: 105 mmol/L (ref 98–111)
Creatinine, Ser: 0.66 mg/dL (ref 0.44–1.00)
GFR, Estimated: >60 mL/min (ref >60)
Glucose, Bld: 112 mg/dL — ABNORMAL HIGH (ref 70–99)
Potassium: 3.7 mmol/L (ref 3.5–5.1)
Sodium: 139 mmol/L (ref 135–145)
Total Bilirubin: 0.7 mg/dL (ref 0.3–1.2)
Total Protein: 7.7 g/dL (ref 6.5–8.1)

## 2020-09-23 LAB — URINALYSIS, COMPLETE (UACMP) WITH MICROSCOPIC
Bilirubin Urine: NEGATIVE
Glucose, UA: NEGATIVE mg/dL
Hgb urine dipstick: NEGATIVE
Ketones, ur: NEGATIVE mg/dL
Nitrite: NEGATIVE
Protein, ur: 30 mg/dL — AB
Specific Gravity, Urine: 1.021 (ref 1.005–1.030)
pH: 6 (ref 5.0–8.0)

## 2020-09-23 LAB — CBC
HCT: 38.2 % (ref 36.0–46.0)
Hemoglobin: 12.8 g/dL (ref 12.0–15.0)
MCH: 28.3 pg (ref 26.0–34.0)
MCHC: 33.5 g/dL (ref 30.0–36.0)
MCV: 84.5 fL (ref 80.0–100.0)
Platelets: 291 10*3/uL (ref 150–400)
RBC: 4.52 MIL/uL (ref 3.87–5.11)
RDW: 13.2 % (ref 11.5–15.5)
WBC: 5.2 10*3/uL (ref 4.0–10.5)
nRBC: 0 % (ref 0.0–0.2)

## 2020-09-23 LAB — LIPASE, BLOOD: Lipase: 27 U/L (ref 11–51)

## 2020-09-23 MED ORDER — ONDANSETRON 4 MG PO TBDP: 4.0000 mg | ORAL_TABLET | Freq: Three times a day (TID) | ORAL | 0 refills | Status: DC | PRN

## 2020-09-23 NOTE — ED Provider Notes (Signed)
[facility] Emergency Department Provider Note   ____________________________________________    I have reviewed the triage vital signs and the nursing notes.   HISTORY  Chief Complaint Emesis and Abdominal Pain     HPI Dominique Ware is a 29 y.o. female who presents with epigastric nominal pain, nausea vomiting and diarrhea.  Patient reports pain and nausea started this morning with diarrhea shortly thereafter.  She admits to drinking significant ounce of liquor last night.  She has never had epigastric pain like this before however after drinking.  No sick contacts reported.  She notes that she received IV Zofran via EMS and feels much better now.  Abdominal pain has improved.  No fevers or chills.  Has not been vaccinated gets COVID-19  Past Medical History:  Diagnosis Date  . Anemia     Patient Active Problem List   Diagnosis Date Noted  . Supervision of other high-risk pregnancy 08/02/2016  . Elevated AFP 04/04/2016  . History of abnormal cervical Pap smear 02/20/2011    History reviewed. No pertinent surgical history.  Prior to Admission medications   Medication Sig Start Date End Date Taking? Authorizing Provider  albuterol (VENTOLIN HFA) 108 (90 Base) MCG/ACT inhaler Inhale 2 puffs into the lungs every 6 (six) hours as needed for wheezing or shortness of breath. 09/09/20   Joni Reining, PA-C  cyclobenzaprine (FLEXERIL) 5 MG tablet Take 1 tablet (5 mg total) by mouth 3 (three) times daily as needed for muscle spasms. 05/12/18   Menshew, Charlesetta Ivory, PA-C  docusate sodium (COLACE) 100 MG capsule Take 1 capsule (100 mg total) by mouth daily as needed for mild constipation. 08/04/16   Christeen Douglas, MD  ferrous sulfate 325 (65 FE) MG tablet Take 1 tablet by mouth daily with breakfast.    [provider]  ketorolac (TORADOL) 10 MG tablet Take 1 tablet (10 mg total) by mouth every 8 (eight) hours. 05/12/18   Menshew, Charlesetta Ivory,  PA-C  medroxyPROGESTERone (DEPO-PROVERA) 150 MG/ML injection Inject 1 mL (150 mg total) into the muscle every 3 (three) months. First dose 08/04/16 08/04/16 05/02/17  Christeen Douglas, MD  metroNIDAZOLE (METROGEL) 0.75 % vaginal gel Place 1 Applicatorful vaginally daily. 06/13/16   [provider]  ondansetron (ZOFRAN ODT) 4 MG disintegrating tablet Take 1 tablet (4 mg total) by mouth every 8 (eight) hours as needed. 09/23/20   Jene Every, MD  Prenatal Vit-Fe Fumarate-FA (PRENATAL MULTIVITAMIN) TABS tablet Take 1 tablet by mouth daily at 12 noon.    [provider]     Allergies Patient has no known allergies.  Family History  Problem Relation Age of Onset  . Heart disease Mother   . Heart attack Mother   . Hypertension Mother   . Cancer Maternal Grandfather     Social History Social History   Tobacco Use  . Smoking status: Current Every Day Smoker    Packs/day: 0.50    Years: 5.00    Pack years: 2.50    Types: Cigarettes  . Smokeless tobacco: Never Used  . Tobacco comment: 1-2 cigarettes daily  Substance Use Topics  . Alcohol use: No    Comment: on occassion - not since pregnancy  . Drug use: No    Review of Systems  Constitutional: No fever/chills Eyes: No visual changes.  ENT: No sore throat. Cardiovascular: Denies chest pain. Respiratory: Denies shortness of breath. Gastrointestinal: As above Genitourinary: Negative for dysuria. Musculoskeletal: Negative for back  pain. Skin: Negative for rash. Neurological: Negative for headaches   ____________________________________________   PHYSICAL EXAM:  VITAL SIGNS: ED Triage Vitals  Enc Vitals Group     BP 09/23/20 1236 (!) 136/91     Pulse Rate 09/23/20 1236 71     Resp 09/23/20 1236 (!) 22     Temp 09/23/20 1236 97.7 F (36.5 C)     Temp Source 09/23/20 1236 Oral     SpO2 09/23/20 1236 100 %     Weight 09/23/20 1237 56.7 kg (125 lb)     Height 09/23/20 1237 1.702 m (5\' 7" )     Head  Circumference --      Peak Flow --      Pain Score 09/23/20 1236 10     Pain Loc --      Pain Edu? --      Excl. in GC? --     Constitutional: Alert and oriented.   Nose: No congestion/rhinnorhea. Mouth/Throat: Mucous membranes are moist.    Cardiovascular: Normal rate, regular rhythm.  Good peripheral circulation. Respiratory: Normal respiratory effort.  No retractions. Lungs CTAB. Gastrointestinal: Soft and nontender. No distention.  No CVA tenderness.  Reassuring exam  Musculoskeletal:.  Warm and well perfused Neurologic:  Normal speech and language. No gross focal neurologic deficits are appreciated.  Skin:  Skin is warm, dry and intact. No rash noted. Psychiatric: Mood and affect are normal. Speech and behavior are normal.  ____________________________________________   LABS (all labs ordered are listed, but only abnormal results are displayed)  Labs Reviewed  COMPREHENSIVE METABOLIC PANEL - Abnormal; Notable for the following components:      Result Value   Glucose, Bld 112 (*)    Calcium 8.8 (*)    All other components within normal limits  LIPASE, BLOOD  CBC  URINALYSIS, COMPLETE (UACMP) WITH MICROSCOPIC  PREGNANCY, URINE   ____________________________________________  EKG   ____________________________________________  RADIOLOGY   ____________________________________________   PROCEDURES  Procedure(s) performed: No  Procedures   Critical Care performed: No ____________________________________________   INITIAL IMPRESSION / ASSESSMENT AND PLAN / ED COURSE  Pertinent labs & imaging results that were available during my care of the patient were reviewed by me and considered in my medical decision making (see chart for details).  Patient presents with epigastric pain, nausea vomiting and an episode of diarrhea.  Suspicious for gastritis related to alcohol use last night, also on the differential is viral gastroenteritis.  Pancreatitis, PUD,  GERD are also on the differential  Given rapid improvement after IV Zofran suspect gastritis related to alcohol.  Lab work today is normal, lipase is 27.  Appropriate for discharge with Rx for Zofran, outpatient follow-up as needed.  Return precautions discussed      ____________________________________________   FINAL CLINICAL IMPRESSION(S) / ED DIAGNOSES  Final diagnoses:  Acute gastritis without hemorrhage, unspecified gastritis type        Note:  This document was prepared using Dragon voice recognition software and may include unintentional dictation errors.   09/25/20, MD 09/23/20 [phone]

## 2020-09-23 NOTE — ED Triage Notes (Signed)
Patient to ED for abdominal pain and vomiting. Woke up at 0700 this morning with  Pain and vomiting. Emesis is described by EMS as yellow. VS with EMS WNL.

## 2020-09-23 NOTE — ED Triage Notes (Signed)
OF note, Received 4 mg Zofran en route with EMS. 20 g left AC

## 2021-07-12 LAB — OB RESULTS CONSOLE VARICELLA ZOSTER ANTIBODY, IGG: Varicella: IMMUNE

## 2021-07-12 LAB — OB RESULTS CONSOLE GC/CHLAMYDIA
Chlamydia: NEGATIVE
Gonorrhea: NEGATIVE

## 2021-07-12 LAB — OB RESULTS CONSOLE HIV ANTIBODY (ROUTINE TESTING): HIV: NONREACTIVE

## 2021-07-12 LAB — OB RESULTS CONSOLE RPR: RPR: REACTIVE

## 2021-07-12 LAB — OB RESULTS CONSOLE RUBELLA ANTIBODY, IGM: Rubella: IMMUNE

## 2021-07-12 LAB — OB RESULTS CONSOLE HEPATITIS B SURFACE ANTIGEN: Hepatitis B Surface Ag: NEGATIVE

## 2021-08-15 ENCOUNTER — Other Ambulatory Visit: Payer: Self-pay | Admitting: Obstetrics and Gynecology

## 2021-08-15 DIAGNOSIS — O210 Mild hyperemesis gravidarum: Secondary | ICD-10-CM

## 2021-08-15 NOTE — Progress Notes (Signed)
Hyperemesis at 14wks, IV fluid and Zofran orders.

## 2021-08-17 ENCOUNTER — Other Ambulatory Visit: Payer: Self-pay

## 2021-08-17 ENCOUNTER — Ambulatory Visit
Admission: RE | Admit: 2021-08-17 | Discharge: 2021-08-17 | Disposition: A | Discharge status: Home / Self Care | Payer: Medicaid Other | Source: Ambulatory Visit | Attending: Obstetrics and Gynecology | Admitting: Obstetrics and Gynecology

## 2021-08-17 DIAGNOSIS — O210 Mild hyperemesis gravidarum: Secondary | ICD-10-CM | POA: Insufficient documentation

## 2021-08-17 MED ORDER — ONDANSETRON HCL 4 MG/2ML IJ SOLN: 4.0000 mg | Freq: Four times a day (QID) | INTRAMUSCULAR | Status: DC | PRN

## 2021-08-17 MED ORDER — THIAMINE HCL 100 MG/ML IJ SOLN
INTRAVENOUS | Status: DC
  Filled 2021-08-17 (×15): qty 1000

## 2021-12-02 NOTE — L&D Delivery Note (Signed)
Delivery Note  Date of delivery: 01/28/2022 Estimated Date of Delivery: 02/08/22 Patient's last menstrual period was 05/04/2021. EGA: [redacted]w[redacted]d  Delivery Note At 12:23 PM a viable female was delivered via Vaginal, Spontaneous (Presentation: Left Occiput Anterior).  APGAR: 8, 9; weight pending.   Placenta status: Spontaneous, Intact.  Cord: 3 vessels with the following complications: none.    First Stage: Labor onset: 0700 Augmentation : none Analgesia /Anesthesia intrapartum: fentanyl  [facility]OM at [facility] presented to L&D with active labor. She was expectantly managed.   Second Stage: Complete dilation at 1142 Onset of pushing at 1149 FHR second stage category I Delivery at 1223 on 01/28/2022  She progressed to complete and had a spontaneous vaginal birth of a live female over an intact perineum. The fetal head was delivered in OA position with restitution to LOA. No nuchal cord. Anterior then posterior shoulders delivered spontaneously. Baby placed on mom's abdomen and attended to by transition RN. Cord clamped and cut after 1 minute by FOB. Cord blood obtained for newborn labs.  Third Stage: Placenta delivered intact with 3VC at 1230 Placenta disposition: routine disposal Uterine tone firm / bleeding minimal IV pitocin given for hemorrhage prophylaxis  Anesthesia: None Episiotomy: None Lacerations:  first degree perineal hemostatic LAC Suture Repair: None Est. Blood Loss (mL):  Complications: none  Mom to postpartum.  Baby to Couplet care / Skin to Skin.  Newborn: Birth Weight: pending  Apgar Scores: 8, 9 Feeding planned: breastfeeding and bottle feeding   Cyril Mourning, CNM 01/28/2022 12:38 PM

## 2022-01-18 ENCOUNTER — Observation Stay: Payer: Medicaid Other

## 2022-01-18 ENCOUNTER — Other Ambulatory Visit: Payer: Self-pay

## 2022-01-18 ENCOUNTER — Observation Stay
Admission: EM | Admit: 2022-01-18 | Discharge: 2022-01-18 | Disposition: A | Discharge status: Home / Self Care | Payer: Medicaid Other | Attending: Obstetrics and Gynecology | Admitting: Obstetrics and Gynecology

## 2022-01-18 ENCOUNTER — Encounter: Payer: Self-pay | Admitting: *Deleted

## 2022-01-18 DIAGNOSIS — O99333 Smoking (tobacco) complicating pregnancy, third trimester: Principal | ICD-10-CM | POA: Insufficient documentation

## 2022-01-18 DIAGNOSIS — Z79899 Other long term (current) drug therapy: Secondary | ICD-10-CM | POA: Insufficient documentation

## 2022-01-18 DIAGNOSIS — F17200 Nicotine dependence, unspecified, uncomplicated: Secondary | ICD-10-CM | POA: Insufficient documentation

## 2022-01-18 DIAGNOSIS — O0973 Supervision of high risk pregnancy due to social problems, third trimester: Secondary | ICD-10-CM | POA: Insufficient documentation

## 2022-01-18 DIAGNOSIS — Z3A37 37 weeks gestation of pregnancy: Secondary | ICD-10-CM | POA: Diagnosis not present

## 2022-01-18 DIAGNOSIS — O99330 Smoking (tobacco) complicating pregnancy, unspecified trimester: Secondary | ICD-10-CM

## 2022-01-18 LAB — CHLAMYDIA/NGC RT PCR (ARMC ONLY)
Chlamydia Tr: NOT DETECTED
N gonorrhoeae: NOT DETECTED

## 2022-01-18 LAB — URINALYSIS, COMPLETE (UACMP) WITH MICROSCOPIC
Bacteria, UA: NONE SEEN
Bilirubin Urine: NEGATIVE
Glucose, UA: NEGATIVE mg/dL
Hgb urine dipstick: NEGATIVE
Ketones, ur: 5 mg/dL — AB
Nitrite: NEGATIVE
Protein, ur: NEGATIVE mg/dL
Specific Gravity, Urine: 1.013 (ref 1.005–1.030)
pH: 6 (ref 5.0–8.0)

## 2022-01-18 LAB — COMPREHENSIVE METABOLIC PANEL
ALT: 14 U/L (ref 0–44)
AST: 21 U/L (ref 15–41)
Albumin: 2.6 g/dL — ABNORMAL LOW (ref 3.5–5.0)
Alkaline Phosphatase: 146 U/L — ABNORMAL HIGH (ref 38–126)
Anion gap: 8 (ref 5–15)
BUN: 11 mg/dL (ref 6–20)
CO2: 22 mmol/L (ref 22–32)
Calcium: 8.6 mg/dL — ABNORMAL LOW (ref 8.9–10.3)
Chloride: 104 mmol/L (ref 98–111)
Creatinine, Ser: 0.47 mg/dL (ref 0.44–1.00)
GFR, Estimated: >60 mL/min (ref >60)
Glucose, Bld: 69 mg/dL — ABNORMAL LOW (ref 70–99)
Potassium: 3.7 mmol/L (ref 3.5–5.1)
Sodium: 134 mmol/L — ABNORMAL LOW (ref 135–145)
Total Bilirubin: 1 mg/dL (ref 0.3–1.2)
Total Protein: 6.2 g/dL — ABNORMAL LOW (ref 6.5–8.1)

## 2022-01-18 LAB — CBC
HCT: 31.1 % — ABNORMAL LOW (ref 36.0–46.0)
Hemoglobin: 9.8 g/dL — ABNORMAL LOW (ref 12.0–15.0)
MCH: 25.7 pg — ABNORMAL LOW (ref 26.0–34.0)
MCHC: 31.5 g/dL (ref 30.0–36.0)
MCV: 81.6 fL (ref 80.0–100.0)
Platelets: 274 10*3/uL (ref 150–400)
RBC: 3.81 MIL/uL — ABNORMAL LOW (ref 3.87–5.11)
RDW: 13.3 % (ref 11.5–15.5)
WBC: 5.9 10*3/uL (ref 4.0–10.5)
nRBC: 0 % (ref 0.0–0.2)

## 2022-01-18 LAB — RAPID HIV SCREEN (HIV 1/2 AB+AG)
HIV 1/2 Antibodies: NONREACTIVE
HIV-1 P24 Antigen - HIV24: NONREACTIVE

## 2022-01-18 LAB — FERRITIN: Ferritin: 6 ng/mL — ABNORMAL LOW (ref 11–307)

## 2022-01-18 LAB — WET PREP, GENITAL
Clue Cells Wet Prep HPF POC: NONE SEEN
Sperm: NONE SEEN
WBC, Wet Prep HPF POC: >=10 — AB (ref <10)
Yeast Wet Prep HPF POC: NONE SEEN

## 2022-01-18 LAB — PROTEIN / CREATININE RATIO, URINE
Creatinine, Urine: 85 mg/dL
Protein Creatinine Ratio: 0.19 mg/mg{Cre} — ABNORMAL HIGH (ref 0.00–0.15)
Total Protein, Urine: 16 mg/dL

## 2022-01-18 LAB — GROUP B STREP BY PCR: Group B strep by PCR: NEGATIVE

## 2022-01-18 MED ORDER — ACETAMINOPHEN 325 MG PO TABS: 650.0000 mg | ORAL_TABLET | ORAL | Status: DC | PRN

## 2022-01-18 MED ORDER — METRONIDAZOLE 500 MG PO TABS
2000.0000 mg | ORAL_TABLET | Freq: Once | ORAL | Status: AC
  Administered 2022-01-18: 2000 mg via ORAL
  Filled 2022-01-18: qty 4

## 2022-01-18 NOTE — Progress Notes (Signed)
Dominique Ware discharged home. Labor precautions reviewed. Discussed treatment for Trich. Pt was unable to tolerate pills. Was able to keep one pill, out of four, down. Beckey Downing, CNM made aware. CNM plans to electronically send a rxn for treatment to pharmacy. Patient made aware. Some labs still pending. Patient aware. Will be notified via phone if necessary regarding abnormal results. Patient left floor ambulatory with significant other, James. Dominique Ware

## 2022-01-18 NOTE — Discharge Summary (Signed)
Patient ID: Dominique Ware MRN: [id number] DOB/AGE: February 09, 1991 31 y.o.  Admit date: 01/18/2022 Discharge date: 01/18/2022  Admission Diagnoses: 31yo G5P2 at [redacted]w[redacted]d sent from the office to triage for equovical NST at the office.  Discharge Diagnoses: RNST  Factors complicating pregnancy: 1. Smoker 2. Trichomonas 12/12/21: rx Metronidazole  3. History of preterm birth 36. History of A1GDM 5. RPR reactive  6. Anemia @ 28wks  Prenatal Procedures: NST  Consults: None  Significant Diagnostic Studies:  Results for orders placed or performed during the hospital encounter of 01/18/22 (from the past 168 hour(s))  Chlamydia/NGC rt PCR ([facility] only)   Collection Time: 01/18/22 11:24 AM   Specimen: Cervical/Vaginal swab  Result Value Ref Range   Specimen source GC/Chlam ENDOCERVICAL    Chlamydia Tr NOT DETECTED NOT DETECTED   N gonorrhoeae NOT DETECTED NOT DETECTED  Wet prep, genital   Collection Time: 01/18/22 11:24 AM   Specimen: Cervical/Vaginal swab  Result Value Ref Range   Yeast Wet Prep HPF POC NONE SEEN NONE SEEN   Trich, Wet Prep PRESENT (A) NONE SEEN   Clue Cells Wet Prep HPF POC NONE SEEN NONE SEEN   WBC, Wet Prep HPF POC >=10 (A) <10   Sperm NONE SEEN   Group B strep by PCR   Collection Time: 01/18/22 11:24 AM   Specimen: Vaginal/Rectal; Genital  Result Value Ref Range   Group B strep by PCR NEGATIVE NEGATIVE  CBC   Collection Time: 01/18/22  1:26 PM  Result Value Ref Range   WBC 5.9 4.0 - 10.5 K/uL   RBC 3.81 (L) 3.87 - 5.11 MIL/uL   Hemoglobin 9.8 (L) 12.0 - 15.0 g/dL   HCT 31.1 (L) 36.0 - 46.0 %   MCV 81.6 80.0 - 100.0 fL   MCH 25.7 (L) 26.0 - 34.0 pg   MCHC 31.5 30.0 - 36.0 g/dL   RDW 13.3 11.5 - 15.5 %   Platelets 274 150 - 400 K/uL   nRBC 0.0 0.0 - 0.2 %  Urinalysis, Complete w Microscopic Urine, Clean Catch   Collection Time: 01/18/22  1:26 PM  Result Value Ref Range   Color, Urine YELLOW (A) YELLOW   APPearance CLEAR (A) CLEAR   Specific Gravity, Urine  1.013 1.005 - 1.030   pH 6.0 5.0 - 8.0   Glucose, UA NEGATIVE NEGATIVE mg/dL   Hgb urine dipstick NEGATIVE NEGATIVE   Bilirubin Urine NEGATIVE NEGATIVE   Ketones, ur 5 (A) NEGATIVE mg/dL   Protein, ur NEGATIVE NEGATIVE mg/dL   Nitrite NEGATIVE NEGATIVE   Leukocytes,Ua LARGE (A) NEGATIVE   RBC / HPF 0-5 0 - 5 RBC/hpf   WBC, UA 6-10 0 - 5 WBC/hpf   Bacteria, UA NONE SEEN NONE SEEN   Squamous Epithelial / LPF 0-5 0 - 5   Mucus PRESENT   Ferritin   Collection Time: 01/18/22  1:26 PM  Result Value Ref Range   Ferritin 6 (L) 11 - 307 ng/mL  Rapid HIV screen (HIV 1/2 Ab+Ag)   Collection Time: 01/18/22  1:26 PM  Result Value Ref Range   HIV-1 P24 Antigen - HIV24 NON REACTIVE NON REACTIVE   HIV 1/2 Antibodies NON REACTIVE NON REACTIVE   Interpretation (HIV Ag Ab)      A non reactive test result means that HIV 1 or HIV 2 antibodies and HIV 1 p24 antigen were not detected in the specimen.  Comprehensive metabolic panel   Collection Time: 01/18/22  1:26 PM  Result Value Ref Range  Sodium 134 (L) 135 - 145 mmol/L   Potassium 3.7 3.5 - 5.1 mmol/L   Chloride 104 98 - 111 mmol/L   CO2 22 22 - 32 mmol/L   Glucose, Bld 69 (L) 70 - 99 mg/dL   BUN 11 6 - 20 mg/dL   Creatinine, Ser 0.47 0.44 - 1.00 mg/dL   Calcium 8.6 (L) 8.9 - 10.3 mg/dL   Total Protein 6.2 (L) 6.5 - 8.1 g/dL   Albumin 2.6 (L) 3.5 - 5.0 g/dL   AST 21 15 - 41 U/L   ALT 14 0 - 44 U/L   Alkaline Phosphatase 146 (H) 38 - 126 U/L   Total Bilirubin 1.0 0.3 - 1.2 mg/dL   GFR, Estimated >60 >60 mL/min   Anion gap 8 5 - 15  Protein / creatinine ratio, urine   Collection Time: 01/18/22  1:26 PM  Result Value Ref Range   Creatinine, Urine 85 mg/dL   Total Protein, Urine 16 mg/dL   Protein Creatinine Ratio 0.19 (H) 0.00 - 0.15 mg/mg[Cre]    Treatments: antibiotics: metronidazole  Hospital Course:  This is a 31 y.o. [id number] with IUP at [redacted]w[redacted]d admitted for sent from the office to triage for equovical NST at the  office.  Equivocal NST NST reactive (see below)  Needs 36 week labs GC/CT, GBS, CBC, RPR, UA, Ferritin, HIV  Elevated BPs on admission Vitals:   01/18/22 1057 01/18/22 1120 01/18/22 1328  BP: (!) 135/93 137/89 132/85  PIH labs WNL with a PCR of 190  Smoker Growth u/s completed - 3310g at 83%ile  She was observed, NST reactive, and she had no signs/symptoms of labor or other maternal-fetal concerns.  She was deemed stable for discharge to home with outpatient follow up.  Discharge Physical Exam:  BP 132/85    Pulse 78    Ht 5\' 2"  (1.575 m)    Wt 70.8 kg    LMP 05/04/2021    BMI 28.53 kg/m   General: NAD CV: RRR Pulm: nl effort ABD: s/nd/nt, gravid DVT Evaluation: LE non-ttp, no evidence of DVT on exam.  NST: FHR baseline: 125 bpm Variability: moderate Accelerations: yes Decelerations: none Category/reactivity: reactive Amniotic Fluid Index: 17.2 cm  TOCO: occasional SVE: deferred      Discharge Condition: Stable  Disposition: Discharge disposition: 01-Home or Self Care        Allergies as of 01/18/2022   No Known Allergies      Medication List     TAKE these medications    albuterol 108 (90 Base) MCG/ACT inhaler Commonly known as: VENTOLIN HFA Inhale 2 puffs into the lungs every 6 (six) hours as needed for wheezing or shortness of breath.   cyclobenzaprine 5 MG tablet Commonly known as: FLEXERIL Take 1 tablet (5 mg total) by mouth 3 (three) times daily as needed for muscle spasms.   docusate sodium 100 MG capsule Commonly known as: Colace Take 1 capsule (100 mg total) by mouth daily as needed for mild constipation.   ferrous sulfate 325 (65 FE) MG tablet Take 1 tablet by mouth daily with breakfast.   ketorolac 10 MG tablet Commonly known as: TORADOL Take 1 tablet (10 mg total) by mouth every 8 (eight) hours.   medroxyPROGESTERone 150 MG/ML injection Commonly known as: DEPO-PROVERA Inject 1 mL (150 mg total) into the muscle every 3  (three) months. First dose 08/04/16   metroNIDAZOLE 0.75 % vaginal gel Commonly known as: METROGEL Place 1 Applicatorful vaginally daily.   ondansetron 4  MG disintegrating tablet Commonly known as: Zofran ODT Take 1 tablet (4 mg total) by mouth every 8 (eight) hours as needed.   prenatal multivitamin Tabs tablet Take 1 tablet by mouth daily at 12 noon.         SignedLinda Hedges, CNM 01/18/2022 7:52 PM

## 2022-01-18 NOTE — OB Triage Note (Signed)
Sent from [facility] office for NST.  Dominique Ware

## 2022-01-19 LAB — RPR: RPR Ser Ql: NONREACTIVE

## 2022-01-24 ENCOUNTER — Other Ambulatory Visit: Payer: Self-pay | Admitting: Certified Nurse Midwife

## 2022-01-24 DIAGNOSIS — D509 Iron deficiency anemia, unspecified: Secondary | ICD-10-CM

## 2022-01-28 ENCOUNTER — Other Ambulatory Visit: Payer: Self-pay

## 2022-01-28 ENCOUNTER — Encounter: Payer: Self-pay | Admitting: Obstetrics and Gynecology

## 2022-01-28 ENCOUNTER — Inpatient Hospital Stay
Admission: EM | Admit: 2022-01-28 | Discharge: 2022-01-30 | DRG: 806 | Disposition: A | Discharge status: Home / Self Care | Payer: Medicaid Other | Attending: Obstetrics and Gynecology | Admitting: Obstetrics and Gynecology

## 2022-01-28 DIAGNOSIS — R030 Elevated blood-pressure reading, without diagnosis of hypertension: Secondary | ICD-10-CM | POA: Diagnosis present

## 2022-01-28 DIAGNOSIS — O99334 Smoking (tobacco) complicating childbirth: Secondary | ICD-10-CM | POA: Diagnosis present

## 2022-01-28 DIAGNOSIS — Z3A38 38 weeks gestation of pregnancy: Secondary | ICD-10-CM

## 2022-01-28 DIAGNOSIS — D62 Acute posthemorrhagic anemia: Secondary | ICD-10-CM | POA: Diagnosis not present

## 2022-01-28 DIAGNOSIS — O9081 Anemia of the puerperium: Secondary | ICD-10-CM | POA: Diagnosis not present

## 2022-01-28 DIAGNOSIS — Z20822 Contact with and (suspected) exposure to covid-19: Secondary | ICD-10-CM | POA: Diagnosis present

## 2022-01-28 DIAGNOSIS — F17210 Nicotine dependence, cigarettes, uncomplicated: Secondary | ICD-10-CM | POA: Diagnosis present

## 2022-01-28 DIAGNOSIS — O26893 Other specified pregnancy related conditions, third trimester: Secondary | ICD-10-CM | POA: Diagnosis present

## 2022-01-28 LAB — TYPE AND SCREEN
ABO/RH(D): O POS
Antibody Screen: NEGATIVE

## 2022-01-28 LAB — COMPREHENSIVE METABOLIC PANEL
ALT: 14 U/L (ref 0–44)
AST: 22 U/L (ref 15–41)
Albumin: 2.8 g/dL — ABNORMAL LOW (ref 3.5–5.0)
Alkaline Phosphatase: 167 U/L — ABNORMAL HIGH (ref 38–126)
Anion gap: 12 (ref 5–15)
BUN: 8 mg/dL (ref 6–20)
CO2: 19 mmol/L — ABNORMAL LOW (ref 22–32)
Calcium: 8.4 mg/dL — ABNORMAL LOW (ref 8.9–10.3)
Chloride: 106 mmol/L (ref 98–111)
Creatinine, Ser: 0.61 mg/dL (ref 0.44–1.00)
GFR, Estimated: >60 mL/min (ref >60)
Glucose, Bld: 87 mg/dL (ref 70–99)
Potassium: 3.7 mmol/L (ref 3.5–5.1)
Sodium: 137 mmol/L (ref 135–145)
Total Bilirubin: 0.2 mg/dL — ABNORMAL LOW (ref 0.3–1.2)
Total Protein: 6.8 g/dL (ref 6.5–8.1)

## 2022-01-28 LAB — URINE DRUG SCREEN, QUALITATIVE (ARMC ONLY)
Amphetamines, Ur Screen: NOT DETECTED
Barbiturates, Ur Screen: NOT DETECTED
Benzodiazepine, Ur Scrn: NOT DETECTED
Cannabinoid 50 Ng, Ur ~~LOC~~: POSITIVE — AB
Cocaine Metabolite,Ur ~~LOC~~: NOT DETECTED
MDMA (Ecstasy)Ur Screen: NOT DETECTED
Methadone Scn, Ur: NOT DETECTED
Opiate, Ur Screen: NOT DETECTED
Phencyclidine (PCP) Ur S: NOT DETECTED
Tricyclic, Ur Screen: NOT DETECTED

## 2022-01-28 LAB — CBC
HCT: 33.6 % — ABNORMAL LOW (ref 36.0–46.0)
Hemoglobin: 10.5 g/dL — ABNORMAL LOW (ref 12.0–15.0)
MCH: 25.4 pg — ABNORMAL LOW (ref 26.0–34.0)
MCHC: 31.3 g/dL (ref 30.0–36.0)
MCV: 81.2 fL (ref 80.0–100.0)
Platelets: 288 10*3/uL (ref 150–400)
RBC: 4.14 MIL/uL (ref 3.87–5.11)
RDW: 13.6 % (ref 11.5–15.5)
WBC: 7.6 10*3/uL (ref 4.0–10.5)
nRBC: 0 % (ref 0.0–0.2)

## 2022-01-28 LAB — PROTEIN / CREATININE RATIO, URINE
Creatinine, Urine: 137 mg/dL
Protein Creatinine Ratio: 0.62 mg/mg{Cre} — ABNORMAL HIGH (ref 0.00–0.15)
Total Protein, Urine: 85 mg/dL

## 2022-01-28 LAB — RESP PANEL BY RT-PCR (FLU A&B, COVID) ARPGX2
Influenza A by PCR: NEGATIVE
Influenza B by PCR: NEGATIVE
SARS Coronavirus 2 by RT PCR: NEGATIVE

## 2022-01-28 MED ORDER — DIPHENHYDRAMINE HCL 25 MG PO CAPS: 25.0000 mg | ORAL_CAPSULE | Freq: Four times a day (QID) | ORAL | Status: DC | PRN

## 2022-01-28 MED ORDER — WITCH HAZEL-GLYCERIN EX PADS: 1.0000 "application " | MEDICATED_PAD | CUTANEOUS | Status: DC | PRN

## 2022-01-28 MED ORDER — DOCUSATE SODIUM 100 MG PO CAPS
100.0000 mg | ORAL_CAPSULE | Freq: Two times a day (BID) | ORAL | Status: DC
  Administered 2022-01-29 – 2022-01-30 (×3): 100 mg via ORAL
  Filled 2022-01-28 (×3): qty 1

## 2022-01-28 MED ORDER — SOD CITRATE-CITRIC ACID 500-334 MG/5ML PO SOLN: 30.0000 mL | ORAL | Status: DC | PRN

## 2022-01-28 MED ORDER — LABETALOL HCL 5 MG/ML IV SOLN
40.0000 mg | INTRAVENOUS | Status: DC | PRN
  Administered 2022-01-28: 40 mg via INTRAVENOUS
  Filled 2022-01-28: qty 8

## 2022-01-28 MED ORDER — OXYTOCIN-SODIUM CHLORIDE 30-0.9 UT/500ML-% IV SOLN
2.5000 [IU]/h | INTRAVENOUS | Status: DC
  Administered 2022-01-28: 2.5 [IU]/h via INTRAVENOUS

## 2022-01-28 MED ORDER — ONDANSETRON HCL 4 MG/2ML IJ SOLN
4.0000 mg | Freq: Four times a day (QID) | INTRAMUSCULAR | Status: DC | PRN
  Administered 2022-01-28: 4 mg via INTRAVENOUS
  Filled 2022-01-28: qty 2

## 2022-01-28 MED ORDER — OXYCODONE HCL 5 MG PO TABS: 10.0000 mg | ORAL_TABLET | ORAL | Status: DC | PRN

## 2022-01-28 MED ORDER — HYDRALAZINE HCL 20 MG/ML IJ SOLN: 10.0000 mg | INTRAMUSCULAR | Status: DC | PRN

## 2022-01-28 MED ORDER — LABETALOL HCL 5 MG/ML IV SOLN: 80.0000 mg | INTRAVENOUS | Status: DC | PRN

## 2022-01-28 MED ORDER — OXYTOCIN-SODIUM CHLORIDE 30-0.9 UT/500ML-% IV SOLN
INTRAVENOUS | Status: AC
  Administered 2022-01-28: 333 mL via INTRAVENOUS
  Filled 2022-01-28: qty 500

## 2022-01-28 MED ORDER — BENZOCAINE-MENTHOL 20-0.5 % EX AERO
1.0000 "application " | INHALATION_SPRAY | CUTANEOUS | Status: DC | PRN
  Administered 2022-01-28 (×2): 1 via TOPICAL
  Filled 2022-01-28: qty 56

## 2022-01-28 MED ORDER — ONDANSETRON HCL 4 MG/2ML IJ SOLN: 4.0000 mg | INTRAMUSCULAR | Status: DC | PRN

## 2022-01-28 MED ORDER — MAGNESIUM SULFATE 40 GM/1000ML IV SOLN
2.0000 g/h | INTRAVENOUS | Status: DC
  Administered 2022-01-28 – 2022-01-29 (×2): 2 g/h via INTRAVENOUS
  Filled 2022-01-28 (×2): qty 1000

## 2022-01-28 MED ORDER — DIBUCAINE (PERIANAL) 1 % EX OINT: 1.0000 "application " | TOPICAL_OINTMENT | CUTANEOUS | Status: DC | PRN

## 2022-01-28 MED ORDER — LIDOCAINE HCL (PF) 1 % IJ SOLN: 30.0000 mL | INTRAMUSCULAR | Status: DC | PRN

## 2022-01-28 MED ORDER — ONDANSETRON HCL 4 MG PO TABS: 4.0000 mg | ORAL_TABLET | ORAL | Status: DC | PRN

## 2022-01-28 MED ORDER — PRENATAL MULTIVITAMIN CH
1.0000 | ORAL_TABLET | Freq: Every day | ORAL | Status: DC
Start: 2022-01-28 — End: 2022-01-30
  Administered 2022-01-28 – 2022-01-30 (×3): 1 via ORAL
  Filled 2022-01-28 (×3): qty 1

## 2022-01-28 MED ORDER — SIMETHICONE 80 MG PO CHEW: 80.0000 mg | CHEWABLE_TABLET | ORAL | Status: DC | PRN

## 2022-01-28 MED ORDER — MISOPROSTOL 200 MCG PO TABS
ORAL_TABLET | ORAL | Status: AC
  Filled 2022-01-28: qty 4

## 2022-01-28 MED ORDER — LACTATED RINGERS IV SOLN: 500.0000 mL | INTRAVENOUS | Status: DC | PRN

## 2022-01-28 MED ORDER — LACTATED RINGERS IV SOLN
INTRAVENOUS | Status: DC
  Administered 2022-01-28: 75 mL via INTRAVENOUS

## 2022-01-28 MED ORDER — LABETALOL HCL 5 MG/ML IV SOLN: 40.0000 mg | INTRAVENOUS | Status: DC | PRN

## 2022-01-28 MED ORDER — MAGNESIUM SULFATE BOLUS VIA INFUSION
4.0000 g | Freq: Once | INTRAVENOUS | Status: AC
  Administered 2022-01-28: 4 g via INTRAVENOUS
  Filled 2022-01-28: qty 1000

## 2022-01-28 MED ORDER — AMMONIA AROMATIC IN INHA
RESPIRATORY_TRACT | Status: DC
Start: 2022-01-28 — End: 2022-01-28
  Filled 2022-01-28: qty 10

## 2022-01-28 MED ORDER — LABETALOL HCL 5 MG/ML IV SOLN: 20.0000 mg | INTRAVENOUS | Status: DC | PRN

## 2022-01-28 MED ORDER — FENTANYL CITRATE (PF) 100 MCG/2ML IJ SOLN
50.0000 ug | INTRAMUSCULAR | Status: DC | PRN
  Administered 2022-01-28: 100 ug via INTRAVENOUS
  Filled 2022-01-28 (×2): qty 2

## 2022-01-28 MED ORDER — OXYTOCIN 10 UNIT/ML IJ SOLN
INTRAMUSCULAR | Status: AC
  Filled 2022-01-28: qty 2

## 2022-01-28 MED ORDER — ACETAMINOPHEN 325 MG PO TABS
650.0000 mg | ORAL_TABLET | ORAL | Status: DC | PRN
  Administered 2022-01-28 – 2022-01-30 (×3): 650 mg via ORAL
  Filled 2022-01-28 (×3): qty 2

## 2022-01-28 MED ORDER — OXYCODONE HCL 5 MG PO TABS: 5.0000 mg | ORAL_TABLET | ORAL | Status: DC | PRN

## 2022-01-28 MED ORDER — FERROUS SULFATE 325 (65 FE) MG PO TABS
325.0000 mg | ORAL_TABLET | Freq: Two times a day (BID) | ORAL | Status: DC
Start: 2022-01-28 — End: 2022-01-30
  Administered 2022-01-28 – 2022-01-30 (×5): 325 mg via ORAL
  Filled 2022-01-28 (×5): qty 1

## 2022-01-28 MED ORDER — LACTATED RINGERS IV SOLN: INTRAVENOUS | Status: DC

## 2022-01-28 MED ORDER — OXYCODONE-ACETAMINOPHEN 5-325 MG PO TABS: 2.0000 | ORAL_TABLET | ORAL | Status: DC | PRN

## 2022-01-28 MED ORDER — LIDOCAINE HCL (PF) 1 % IJ SOLN
INTRAMUSCULAR | Status: DC
Start: 2022-01-28 — End: 2022-01-28
  Filled 2022-01-28: qty 30

## 2022-01-28 MED ORDER — LABETALOL HCL 5 MG/ML IV SOLN
20.0000 mg | INTRAVENOUS | Status: DC | PRN
  Administered 2022-01-28: 20 mg via INTRAVENOUS
  Filled 2022-01-28: qty 4

## 2022-01-28 MED ORDER — ACETAMINOPHEN 325 MG PO TABS: 650.0000 mg | ORAL_TABLET | ORAL | Status: DC | PRN

## 2022-01-28 MED ORDER — IBUPROFEN 600 MG PO TABS
600.0000 mg | ORAL_TABLET | Freq: Four times a day (QID) | ORAL | Status: DC
  Administered 2022-01-28 – 2022-01-30 (×9): 600 mg via ORAL
  Filled 2022-01-28 (×10): qty 1

## 2022-01-28 MED ORDER — OXYTOCIN BOLUS FROM INFUSION: 333.0000 mL | Freq: Once | INTRAVENOUS | Status: AC

## 2022-01-28 MED ORDER — OXYCODONE-ACETAMINOPHEN 5-325 MG PO TABS: 1.0000 | ORAL_TABLET | ORAL | Status: DC | PRN

## 2022-01-28 MED ORDER — TETANUS-DIPHTH-ACELL PERTUSSIS 5-2.5-18.5 LF-MCG/0.5 IM SUSY
0.5000 mL | PREFILLED_SYRINGE | Freq: Once | INTRAMUSCULAR | Status: DC
  Filled 2022-01-28: qty 0.5

## 2022-01-28 MED ORDER — COCONUT OIL OIL
1.0000 "application " | TOPICAL_OIL | Status: DC | PRN
  Filled 2022-01-28: qty 120

## 2022-01-28 NOTE — Lactation Note (Signed)
This note was copied from a baby's chart. Lactation Consultation Note  Patient Name: Dominique Ware [id number] Date: 01/28/2022 Reason for consult: L&D Initial assessment;1st time breastfeeding;Early term 37-38.6wks;Other (Comment) (nipple shield) Age:33 hours  Mom is P3, <1hr after SVD. Mom did not breastfeed her other 2 children and desires to "try" breastfeeding with this baby. Initial L&D assessment in LDR1.  Maternal Data Has patient been taught Hand Expression?: Yes Does the patient have breastfeeding experience prior to this delivery?: No (did not BF other children)  Feeding Mother's Current Feeding Choice: Breast Milk and Formula  LATCH Score Latch: Repeated attempts needed to sustain latch, nipple held in mouth throughout feeding, stimulation needed to elicit sucking reflex.  Audible Swallowing: A few with stimulation  Type of Nipple: Everted at rest and after stimulation  Comfort (Breast/Nipple): Filling, red/small blisters or bruises, mild/mod discomfort (discomfort w/ baby at breast)  Hold (Positioning): Assistance needed to correctly position infant at breast and maintain latch. (would not allow much help)  LATCH Score: 6  LC at bedside to assist with first feeding post delivery. Baby was calm, but awake. We discussed position options and mom wanted baby across her. Baby ended up in a modified cradle/laid back positional hold w/ mom using her arm/elbow to move and adjust baby. LC attempted several times to readjust baby, but it was difficult to get him in a good position. Nipple shield placed due to tight tissue that was flattening out the nipple- size 20. Baby was creating some discomfort on/off throughout feeding attempt when he would clamp down. After several minutes mom showed discomfort and feeding was ended.  Lactation Tools Discussed/Used Tools: Nipple Shields Nipple shield size: 20 (L breast; right may be fine)  Interventions Interventions: Breast feeding  basics reviewed;Assisted with latch;Hand express;Support pillows;Position options;Adjust position;Education  Discharge    Consult Status Consult Status: Follow-up from L&D    Danford Bad 01/28/2022, 1:09 PM

## 2022-01-28 NOTE — H&P (Signed)
OB History & Physical   History of Present Illness:  Chief Complaint:   HPI:  Dominique Ware is a 31 y.o. [id number] female at 110w3d dated by LMP.  She presents to L&D for active labor  She reports:  -active fetal movement -no leakage of fluid -no vaginal bleeding -onset of contractions at 0730 currently every 2-5 minutes  Pregnancy Issues: 1. Smoker 2. Trichomonas 12/12/21: rx Metronidazole  3. History of preterm birth 27. History of A1GDM 5. RPR reactive  6. Anemia @ 28wks   Maternal Medical History:   Past Medical History:  Diagnosis Date   Anemia     Past Surgical History:  Procedure Laterality Date   WISDOM TOOTH EXTRACTION      No Known Allergies  Prior to Admission medications   Medication Sig Start Date End Date Taking? Authorizing Provider  albuterol (VENTOLIN HFA) 108 (90 Base) MCG/ACT inhaler Inhale 2 puffs into the lungs every 6 (six) hours as needed for wheezing or shortness of breath. Patient not taking: Reported on 01/18/2022 09/09/20   Joni Reining, PA-C  cyclobenzaprine (FLEXERIL) 5 MG tablet Take 1 tablet (5 mg total) by mouth 3 (three) times daily as needed for muscle spasms. Patient not taking: Reported on 08/17/2021 05/12/18   Menshew, Charlesetta Ivory, PA-C  docusate sodium (COLACE) 100 MG capsule Take 1 capsule (100 mg total) by mouth daily as needed for mild constipation. Patient not taking: Reported on 08/17/2021 08/04/16   Christeen Douglas, MD  ferrous sulfate 325 (65 FE) MG tablet Take 1 tablet by mouth daily with breakfast. Patient not taking: Reported on 01/28/2022    [provider]  ketorolac (TORADOL) 10 MG tablet Take 1 tablet (10 mg total) by mouth every 8 (eight) hours. Patient not taking: Reported on 08/17/2021 05/12/18   Menshew, Charlesetta Ivory, PA-C  medroxyPROGESTERone (DEPO-PROVERA) 150 MG/ML injection Inject 1 mL (150 mg total) into the muscle every 3 (three) months. First dose 08/04/16 08/04/16 05/02/17  Christeen Douglas, MD   metroNIDAZOLE (METROGEL) 0.75 % vaginal gel Place 1 Applicatorful vaginally daily. Patient not taking: Reported on 01/28/2022 06/13/16   [provider]  ondansetron (ZOFRAN ODT) 4 MG disintegrating tablet Take 1 tablet (4 mg total) by mouth every 8 (eight) hours as needed. Patient not taking: Reported on 01/18/2022 09/23/20   Jene Every, MD  Prenatal Vit-Fe Fumarate-FA (PRENATAL MULTIVITAMIN) TABS tablet Take 1 tablet by mouth daily at 12 noon. Patient not taking: Reported on 01/28/2022    [provider]     Prenatal care site: [facility]   Social History: She  reports that she has been smoking cigarettes. She has a 2.50 pack-year smoking history. She has never used smokeless tobacco. She reports current drug use. Drug: Marijuana. She reports that she does not drink alcohol.  Family History: family history includes Cancer in her maternal grandfather; Heart attack in her mother; Heart disease in her mother; Hypertension in her mother.   Review of Systems: A full review of systems was performed and negative except as noted in the HPI.    Physical Exam:  Vital Signs: BP (!) 150/96 (BP Location: Left Arm)    Pulse 83    Temp 97.8 F (36.6 C) (Oral)    Resp 16    Ht 5\' 2"  (1.575 m)    Wt 72.1 kg    LMP 05/04/2021    BMI 29.08 kg/m   General:   alert and cooperative  Skin:  normal  Neurologic:    Alert & oriented x 3  Lungs:    Nl effort  Heart:   regular rate and rhythm  Abdomen:  normal findings: soft, non-tender between UCs  Extremities: : non-tender, symmetric, no edema bilaterally.     EFW: 01/18/22    3310g    83%ile  Pertinent Results:  Prenatal Labs: Blood type/Rh O pos  Antibody screen neg  Rubella Immune  Varicella Immune  RPR Pos with T. Palldum NR  HBsAg Neg  HIV NR  GC neg  Chlamydia neg  Genetic screening negative  1 hour GTT 69  3 hour GTT   GBS Neg   FHT: FHR: 120 bpm, variability: moderate,  accelerations:  Present,   decelerations:  Absent Category/reactivity:  Category I TOCO: regular, every 2-5 minutes SVE: Dilation: 6.5 / Effacement (%): 90 / Station: 0    Cephalic by leopolds  US OB Comp + 14 Wk  Result Date: 01/18/2022 CLINICAL DATA:  Growth, anatomy, tobacco smoking. EXAM: OBSTETRICAL ULTRASOUND >14 WKS AND BIOPHYSICAL PROFILE FINDINGS: Number of Fetuses: 1 Heart Rate:  136 bpm Movement: Yes Presentation: Cephalic Previa: No Placental Location: Fundal Amniotic Fluid (Subjective): Normal Amniotic Fluid (Objective): Vertical pocket 5.4cm AFI 17.2 cm (5%ile= 7.5 cm, 95%= 24.4 cm for 37 wks) FETAL BIOMETRY BPD:  9.0cm 36w 3d HC:    32.7cm 37w 1d AC:   34.9cm 38w 5d FL:   7.1cm 36w 3d Current Mean GA: 37w 1d [place] EDC: 02/07/2022 Assigned GA: 37w 0d Assigned EDC: 02/08/2022 Estimated Fetal Weight:  3310g    83%ile FETAL ANATOMY Lateral Ventricles: Appears normal Thalami/CSP: Appears normal Posterior Fossa: Not visualized Nuchal Region: Not visualized    NFT= N/A > 20 WKS Upper Lip: Not visualized Spine: Appears normal 4 Chamber Heart on Left: Appears normal LVOT: Appears normal RVOT: Appears normal Stomach on Left: Appears normal 3 Vessel Cord: Appears normal Cord Insertion site: Appears normal Kidneys: Appears normal Bladder: Appears normal Extremities: Left lower extremity not well-seen, remaining appears normal. Sex: Not Visualized Technical Limitations: Advanced gestational age Maternal Findings: Cervix:  Normal IMPRESSION: 1. Single live intrauterine gestation with approximate gestational age of [redacted] weeks and 1 day, EDC based on today's sonogram is 02/07/2022. 2. Visualized fetal anatomy is within normal limits. Limited evaluation of the left lower extremity, nose/lips, posterior fossa, genitalia due to advanced gestational age and fetal positioning. Electronically Signed   By: Larose Hires D.O.   On: 01/18/2022 13:24     Assessment:  Dominique Ware is a 31 y.o. [id number] female at [redacted]w[redacted]d with active labor.    Plan:  1. Admit to Labor & Delivery; consents reviewed and obtained  2. Fetal Well being  - Fetal Tracing: Cat I - GBS neg - Presentation: VTX confirmed by SVE   3. Routine OB: - Prenatal labs reviewed, as above - Rh pos - CBC & T&S on admit - Clear fluids, IVF  4. Monitoring of Labor -  Contractions by external toco in place -  Pelvis proven to 2637g -  Plan for continuous fetal monitoring  -  Maternal pain control as desired: IVPM, nitrous, regional anesthesia - Anticipate vaginal delivery  5. Post Partum Planning: - Infant feeding: TBD - Contraception: TBD - Tdap: not done in AP setting - Flu: not done in AP setting  Jazline Cumbee, CNM 01/28/2022 8:59 AM

## 2022-01-28 NOTE — Discharge Summary (Signed)
Obstetrical Discharge Summary ? ?Patient Name: Dominique Ware ?DOB: 01-19-1991 ?MRN: [id number] ? ?Date of Admission: 01/28/2022 ?Date of Delivery: 01/30/2022 ?Delivered by: Rochele Raring CNM ?Date of Discharge: 01/30/2022 ? ?Primary OB: [facility]  ?[id number] last menstrual period was 05/04/2021. ?EDC Estimated Date of Delivery: 02/08/22 ?Gestational Age at Delivery: [redacted]w[redacted]d  ? ?Antepartum complications:  ?1. Smoker ?2. Trichomonas 12/12/21: rx Metronidazole  ?3. History of preterm birth ?4. History of A1GDM ?5. RPR reactive  ?6. Anemia @ 28wks ? ?Admitting Diagnosis: active labor ?Secondary Diagnosis: ?Patient Active Problem List  ? Diagnosis Date Noted  ? Normal labor 01/28/2022  ? Supervision of other high-risk pregnancy 08/02/2016  ? Elevated AFP 04/04/2016  ? History of abnormal cervical Pap smear 02/20/2011  ? ? ?Augmentation:  none ?Complications: None ? ?Intrapartum complications/course: see delivery notes.  ?Delivery Type: spontaneous vaginal delivery ?Anesthesia: none ?Placenta: spontaneous ?Laceration: first degree perineal lac ?Episiotomy: none ?Newborn Data: ?Live born female  ?Birth Weight:  7#4 ?APGAR: 8, 9 ? ?Newborn Delivery   ?Birth date/time: 01/28/2022 12:23:00 ?Delivery type: Vaginal, Spontaneous ?  ?  ? ?31 y.o. [id number] at [redacted]w[redacted]d presenting with active labor, AROM with clear fluid.  She progressed to complete and pushed over an intact perineum and delivered the fetal head, followed promptly by the shoulders. She was in control the whole time, and the baby placed on the maternal abdomen. Delayed cord clamping and the FOB cut the baby's cord, while the baby was skin to skin. The placenta delivered spontaneously and intact. First degree perineal laceration that was hemostatic and did not require a stitch. Mom and baby tolerated the procedure well.  ? ?Postpartum Procedures: Mag sulfate x 24hrs after delivery, iron infusion on PPD2 ? ?Post partum course:  ?Patient had an uncomplicated postpartum course.   By time of discharge on PPD#2, her pain was controlled on oral pain medications; she had appropriate lochia and was ambulating, voiding without difficulty and tolerating regular diet. Blood pressure was stable on Procardia 30mg  XL daily.  She was deemed stable for discharge to home.   ? ?Discharge Physical Exam:  ?BP 130/88 (BP Location: Left Arm)   Pulse 70   Temp 98.9 ?F (37.2 ?C) (Oral)   Resp 18   Ht 5\' 2"  (1.575 m)   Wt 72.1 kg   LMP 05/04/2021   SpO2 100%   Breastfeeding Unknown   BMI 29.08 kg/m?  ? ?General: alert and no distress ?Pulm: normal respiratory effort ?Lochia: appropriate ?Abdomen: soft, NT ?Uterine Fundus: firm, below umbilicus ?Extremities: No evidence of DVT seen on physical exam. No lower extremity edema. ?Edinburgh: No flowsheet data found.  ? ?Labs: ?CBC Latest Ref Rng & Units 01/29/2022 01/28/2022 01/18/2022  ?WBC 4.0 - 10.5 K/uL 9.9 7.6 5.9  ?Hemoglobin 12.0 - 15.0 g/dL 8.4(L) 10.5(L) 9.8(L)  ?Hematocrit 36.0 - 46.0 % 26.8(L) 33.6(L) 31.1(L)  ?Platelets 150 - 400 K/uL 269 288 274  ? ?O POS ?Hemoglobin  ?Date Value Ref Range Status  ?01/29/2022 8.4 (L) 12.0 - 15.0 g/dL Final  ? ?HCT  ?Date Value Ref Range Status  ?01/29/2022 26.8 (L) 36.0 - 46.0 % Final  ? ? ?Disposition: stable, discharge to home ?Baby Feeding: breastmilk and formula ?Baby Disposition: home with mom ? ?Contraception: Depo on day of DC ? ?Prenatal Labs:  ?Blood type/Rh O pos  ?Antibody screen neg  ?Rubella Immune  ?Varicella Immune  ?RPR Pos with T. Palldum NR  ?HBsAg Neg  ?HIV NR  ?GC neg  ?Chlamydia neg  ?  Genetic screening negative  ?1 hour GTT 69  ?3 hour GTT    ?GBS Neg  ? ?Rh Immune globulin given: n/a ?Rubella vaccine given: immune ?Varicella vaccine given: immune ?Tdap vaccine given in AP or PP setting: give prior to discharge- declined ?Flu vaccine given in AP or PP setting: give prior to discharge-declined ? ?Plan: ?Dominique Ware was discharged to home in good condition. ? ?Discharge Instructions: Per After  Visit Summary. ?Activity: Advance as tolerated. Pelvic rest for 6 weeks.   ?Diet: Regular ?Discharge Medications: ?Allergies as of 01/30/2022   ?No Known Allergies ?  ? ?  ?Medication List  ?  ? ?STOP taking these medications   ? ?albuterol 108 (90 Base) MCG/ACT inhaler ?Commonly known as: VENTOLIN HFA ?  ?cyclobenzaprine 5 MG tablet ?Commonly known as: FLEXERIL ?  ?ketorolac 10 MG tablet ?Commonly known as: TORADOL ?  ?medroxyPROGESTERone 150 MG/ML injection ?Commonly known as: DEPO-PROVERA ?  ?metroNIDAZOLE 0.75 % vaginal gel ?Commonly known as: METROGEL ?  ?ondansetron 4 MG disintegrating tablet ?Commonly known as: Zofran ODT ?  ? ?  ? ?TAKE these medications   ? ?acetaminophen 325 MG tablet ?Commonly known as: Tylenol ?Take 2 tablets (650 mg total) by mouth every 4 (four) hours as needed (for pain scale < 4). ?  ?benzocaine-Menthol 20-0.5 % Aero ?Commonly known as: DERMOPLAST ?Apply 1 application topically as needed for irritation (perineal discomfort). ?  ?diphenhydrAMINE 25 mg capsule ?Commonly known as: BENADRYL ?Take 1 capsule (25 mg total) by mouth every 6 (six) hours as needed for itching. ?  ?docusate sodium 100 MG capsule ?Commonly known as: COLACE ?Take 1 capsule (100 mg total) by mouth 2 (two) times daily. ?What changed:  ?when to take this ?reasons to take this ?  ?ferrous sulfate 325 (65 FE) MG tablet ?Take 1 tablet (325 mg total) by mouth 2 (two) times daily with a meal. ?What changed: when to take this ?  ?ibuprofen 600 MG tablet ?Commonly known as: ADVIL ?Take 1 tablet (600 mg total) by mouth every 6 (six) hours. ?  ?NIFEdipine 30 MG 24 hr tablet ?Commonly known as: ADALAT CC ?Take 1 tablet (30 mg total) by mouth daily. ?  ?prenatal multivitamin Tabs tablet ?Take 1 tablet by mouth daily at 12 noon. ?  ?witch hazel-glycerin pad ?Commonly known as: TUCKS ?Apply 1 application topically as needed for hemorrhoids. ?  ? ?  ? ?Outpatient follow up:  ? Follow-up Information   ? ? [facility]  Follow up in 2 day(s).   ?Why: postpartum BP check ?Contact information: ?[address] ?[phone] ? ?  ?  ? ?  ?  ? ?  ? ? ?Signed: ?Francetta Found, CNM ?01/30/2022 ?9:28 AM ? ?

## 2022-01-28 NOTE — Progress Notes (Signed)
Labor Progress Note  Dominique Ware is a 31 y.o. [id number] at [redacted]w[redacted]d by LMP admitted for active labor  Subjective: Patient managing well with IV pain medication. Partner is at bedside.   Objective: BP (!) 161/95 (BP Location: Right Arm)    Pulse 66    Temp 97.8 F (36.6 C) (Oral)    Resp 16    Ht 5\' 2"  (1.575 m)    Wt 72.1 kg    LMP 05/04/2021    BMI 29.08 kg/m  Notable VS details:  Vitals:   01/28/22 0824 01/28/22 1009  BP: (!) 150/96 (!) 161/95     Fetal Assessment: FHT:  FHR: 120 bpm, variability: moderate,  accelerations:  Present,  decelerations:  Absent Category/reactivity:  Category I UC:   regular, every 2 minutes SVE:    Dilation: 9cm  Effacement: 90%  Station:  0  Consistency: soft  Position: anterior  Membrane status:AROM @ 1035 Amniotic color: clear  Labs: Lab Results  Component Value Date   WBC 7.6 01/28/2022   HGB 10.5 (L) 01/28/2022   HCT 33.6 (L) 01/28/2022   MCV 81.2 01/28/2022   PLT 288 01/28/2022    Assessment / Plan: Spontaneous labor, progressing normally  Labor: Progressing normally Preeclampsia:   elevated BP , 20mg  labetalol given, PIH labs pending, denies HA, visual changes, epigastric pain Fetal Wellbeing:  Category I Pain Control:  IV pain meds I/D:   afebrile, GBS negative, AROM <1hr Anticipated MOD:  NSVD  01/30/2022, CNM 01/28/2022, 10:36 AM

## 2022-01-29 ENCOUNTER — Encounter: Payer: Self-pay | Admitting: Obstetrics and Gynecology

## 2022-01-29 LAB — CBC
HCT: 26.8 % — ABNORMAL LOW (ref 36.0–46.0)
Hemoglobin: 8.4 g/dL — ABNORMAL LOW (ref 12.0–15.0)
MCH: 25.3 pg — ABNORMAL LOW (ref 26.0–34.0)
MCHC: 31.3 g/dL (ref 30.0–36.0)
MCV: 80.7 fL (ref 80.0–100.0)
Platelets: 269 10*3/uL (ref 150–400)
RBC: 3.32 MIL/uL — ABNORMAL LOW (ref 3.87–5.11)
RDW: 13.6 % (ref 11.5–15.5)
WBC: 9.9 10*3/uL (ref 4.0–10.5)
nRBC: 0 % (ref 0.0–0.2)

## 2022-01-29 LAB — RPR: RPR Ser Ql: NONREACTIVE

## 2022-01-29 MED ORDER — NIFEDIPINE ER OSMOTIC RELEASE 30 MG PO TB24
30.0000 mg | ORAL_TABLET | Freq: Every day | ORAL | Status: DC
  Administered 2022-01-29 – 2022-01-30 (×2): 30 mg via ORAL
  Filled 2022-01-29 (×3): qty 1

## 2022-01-29 NOTE — TOC Initial Note (Signed)
Transition of Care [facility]) - Initial/Assessment Note    Patient Details  Name: Dominique Ware MRN: [id number] Date of Birth: [birth year]  Transition of Care [facility]) CM/SW Contact:    Anselm Pancoast, RN Phone Number: 01/29/2022, 9:49 AM  Clinical Narrative:                 Met with MOB at bedside with infant in room. MOB was pleasant and appropriate. Reports she is wanting to try and breastfeed this infant and working with [employer]. Has 29 and 64 year old son at home who attend school during the day. MOB reports she works third shift but is unsure of if she will return to that shift after recovery. Lives with her mother and was looking for her own place prior to delivery however anticipates she will stay with her mother for a few more weeks to get settled. MOB understands mandated reporting if infant test positive for MJ and understood she tested positive. Currently no needs or concerns. Has all needed equipment and strong support system for recovery. No concerns related to anxiety/depression/PPD.         Patient Goals and CMS Choice        Expected Discharge Plan and Services                                                Prior Living Arrangements/Services                       Activities of Daily Living Home Assistive Devices/Equipment: Eyeglasses ADL Screening (condition at time of admission) Patient's cognitive ability adequate to safely complete daily activities?: Yes Is the patient deaf or have difficulty hearing?: No Does the patient have difficulty seeing, even when wearing glasses/contacts?: No Does the patient have difficulty concentrating, remembering, or making decisions?: No Patient able to express need for assistance with ADLs?: Yes Does the patient have difficulty dressing or bathing?: No Independently performs ADLs?: Yes (appropriate for developmental age) Does the patient have difficulty walking or climbing stairs?: No Weakness of Legs: None Weakness  of Arms/Hands: None  Permission Sought/Granted                  Emotional Assessment              Admission diagnosis:  Normal labor [O80, Z37.9] Patient Active Problem List   Diagnosis Date Noted   Normal labor 01/28/2022   Supervision of other high-risk pregnancy 08/02/2016   Elevated AFP 04/04/2016   History of abnormal cervical Pap smear 02/20/2011   PCP:  [facility]:   [facility] [place], [place] - [address] [place] [address] Phone: [phone] Fax: [place] [address] ([address]), [place] [address] [place]) [place] [address] Phone: [phone] Fax: [phone]     Social Determinants of Health (SDOH) Interventions    Readmission Risk Interventions No flowsheet data found.

## 2022-01-29 NOTE — Progress Notes (Signed)
Postpartum Day  1  Subjective: no complaints, up ad lib, voiding, tolerating PO, and + flatus  Doing well, no concerns. Ambulating without difficulty, pain managed with PO meds, tolerating regular diet, and voiding without difficulty.   No fever/chills, chest pain, shortness of breath, nausea/vomiting, or leg pain. No nipple or breast pain. No headache, visual changes, or RUQ/epigastric pain.  Objective: BP 134/88    Pulse 91    Temp 97.9 F (36.6 C) (Oral)    Resp 14    Ht 5\' 2"  (1.575 m)    Wt 72.1 kg    LMP 05/04/2021    SpO2 100%    Breastfeeding Unknown    BMI 29.08 kg/m    Physical Exam:  General: alert, cooperative, and appears stated age Breasts: soft/nontender CV: RRR Pulm: nl effort, CTABL Abdomen: soft, non-tender, active bowel sounds Uterine Fundus: firm Perineum: minimal edema, repair well approximated Lochia: appropriate DVT Evaluation: No evidence of DVT seen on physical exam. Negative Homan's sign. No cords or calf tenderness. No significant calf/ankle edema.  Recent Labs    01/28/22 0928 01/29/22 0545  HGB 10.5* 8.4*  HCT 33.6* 26.8*  WBC 7.6 9.9  PLT 288 269    Assessment/Plan: 31 y.o. 32 postpartum day # 1 -continue monitoring for s/s of Pre-E -BP normotensive -Continue on Magnesium 2g/hr  -Continue routine postpartum care -Encouraged snug fitting bra, cold application, Tylenol PRN, and cabbage leaves for engorgement for formula feeding  -Discussed contraceptive options including implant, IUDs hormonal and non-hormonal, injection, pills/ring/patch, condoms, and NFP.  -Acute blood loss anemia - hemodynamically stable and asymptomatic; start PO ferrous sulfate BID with stool softeners  -Immunization status:   needs TDap prior to discharge    Disposition: Continue inpatient postpartum care   LOS: 1 day     ----- [id number]  Certified Nurse Midwife [facility]

## 2022-01-30 MED ORDER — MEDROXYPROGESTERONE ACETATE 150 MG/ML IM SUSP
150.0000 mg | Freq: Once | INTRAMUSCULAR | Status: AC
Start: 2022-01-30 — End: 2022-01-30
  Administered 2022-01-30: 150 mg via INTRAMUSCULAR
  Filled 2022-01-30: qty 1

## 2022-01-30 MED ORDER — IBUPROFEN 600 MG PO TABS: 600.0000 mg | ORAL_TABLET | Freq: Four times a day (QID) | ORAL | 0 refills | Status: DC

## 2022-01-30 MED ORDER — FERROUS SULFATE 325 (65 FE) MG PO TABS: 325.0000 mg | ORAL_TABLET | Freq: Two times a day (BID) | ORAL | 0 refills | Status: AC

## 2022-01-30 MED ORDER — NIFEDIPINE ER 30 MG PO TB24: 30.0000 mg | ORAL_TABLET | Freq: Every day | ORAL | 0 refills | Status: AC

## 2022-01-30 MED ORDER — DIPHENHYDRAMINE HCL 25 MG PO CAPS
25.0000 mg | ORAL_CAPSULE | Freq: Four times a day (QID) | ORAL | 0 refills | Status: DC | PRN
Start: 2022-01-30 — End: 2023-02-17

## 2022-01-30 MED ORDER — WITCH HAZEL-GLYCERIN EX PADS: 1.0000 "application " | MEDICATED_PAD | CUTANEOUS | 12 refills | Status: AC | PRN

## 2022-01-30 MED ORDER — SODIUM CHLORIDE 0.9 % IV SOLN
300.0000 mg | Freq: Once | INTRAVENOUS | Status: AC
  Administered 2022-01-30: 300 mg via INTRAVENOUS
  Filled 2022-01-30: qty 300

## 2022-01-30 MED ORDER — DOCUSATE SODIUM 100 MG PO CAPS: 100.0000 mg | ORAL_CAPSULE | Freq: Two times a day (BID) | ORAL | 0 refills | Status: DC

## 2022-01-30 MED ORDER — ACETAMINOPHEN 325 MG PO TABS: 650.0000 mg | ORAL_TABLET | ORAL | 0 refills | Status: DC | PRN

## 2022-01-30 MED ORDER — BENZOCAINE-MENTHOL 20-0.5 % EX AERO: 1.0000 "application " | INHALATION_SPRAY | CUTANEOUS | Status: AC | PRN

## 2022-01-30 NOTE — Progress Notes (Signed)
Discharge instructions, prescriptions, education, and appointments given and explained. Pt verbalized understanding with no further questions 

## 2022-01-30 NOTE — Progress Notes (Signed)
Pt wheeled to personal vehicle via staff with infant and all personal belongings. ?

## 2022-01-30 NOTE — Progress Notes (Signed)
Postpartum Day  2 ? ?Subjective: ?no complaints, up ad lib, voiding, tolerating PO, and + flatus ? ?Doing well, no concerns. Ambulating without difficulty, pain managed with PO meds, tolerating regular diet, and voiding without difficulty.  ? ?No fever/chills, chest pain, shortness of breath, nausea/vomiting, or leg pain. No nipple or breast pain. No headache, visual changes, or RUQ/epigastric pain. ? ?Objective: ?BP 130/88 (BP Location: Left Arm)   Pulse 70   Temp 98.9 ?F (37.2 ?C) (Oral)   Resp 18   Ht 5\' 2"  (1.575 m)   Wt 72.1 kg   LMP 05/04/2021   SpO2 100%   Breastfeeding Unknown   BMI 29.08 kg/m?  ?  ?Physical Exam:  ?General: alert, cooperative, and appears stated age ?Breasts: soft/nontender ?CV: RRR ?Pulm: nl effort, CTABL ?Abdomen: soft, non-tender, active bowel sounds ?Uterine Fundus: firm ?Perineum: minimal edema, repair well approximated ?Lochia: appropriate ?DVT Evaluation: No evidence of DVT seen on physical exam. ?Negative Homan's sign. ?No cords or calf tenderness. ?No significant calf/ankle edema. ? ?Recent Labs  ?  01/28/22 ?0928 01/29/22 ?01/31/22  ?HGB 10.5* 8.4*  ?HCT 33.6* 26.8*  ?WBC 7.6 9.9  ?PLT 288 269  ? ? ?Assessment/Plan: ?31 y.o. 32 postpartum day # 2 ?-continue monitoring for s/s of Pre-E ?-BP normotensive- Procardia 30mg  daily.  ? ? ?-Continue routine postpartum care ?-Encouraged snug fitting bra, cold application, Tylenol PRN, and cabbage leaves for engorgement for formula feeding  ?-Discussed contraceptive options including implant, IUDs hormonal and non-hormonal, injection, pills/ring/patch, condoms, and NFP. Wants Depo prior to DC.  ?-Acute blood loss anemia - hemodynamically stable and asymptomatic; start PO ferrous sulfate BID with stool softeners; iron infusion today.  ?-Immunization status:   needs TDap prior to discharge  ? ? ?Disposition: Discharge home today.  ? LOS: 2 days  ? ? ?[id number], CNM ? ?Certified Nurse Midwife ?[facility] ?[facility]  ?

## 2022-01-30 NOTE — TOC Progression Note (Signed)
Transition of Care (TOC) - Progression Note  ? ? ?Patient Details  ?Name: Dominique Ware ?MRN: [id number] ?Date of Birth: 13-Mar-1991 ? ?Transition of Care (TOC) CM/SW Contact  ?Ball Ground Cellar, RN ?Phone Number: ?01/30/2022, 9:38 AM ? ?Clinical Narrative:    ?Spoke to patient regarding concerns related to seeking her own housing. Reports she currently lives with her mother but is working to get her own home. Has been unable to secure a rental due to criminal record from over 10 years ago. RN CM suggested patient outreach to [facility] DSS for housing assistance. Patient reports she has also already called [facility] and was told to walk in for [facility] enrollment instead of scheduling appointment. No other needs or concerns at this time.  ? ? ?  ?  ? ?Expected Discharge Plan and Services ?  ?  ?  ?  ?  ?Expected Discharge Date: 01/30/22               ?  ?  ?  ?  ?  ?  ?  ?  ?  ?  ? ? ?Social Determinants of Health (SDOH) Interventions ?  ? ?Readmission Risk Interventions ?No flowsheet data found. ? ?

## 2022-02-03 IMAGING — DX DG CHEST 1V PORT
1 series · 1 of 1 positions shown · non-contrast
Comparison: Chest radiograph 01/16/2008.

CLINICAL DATA: Patient with sore throat.  Generalized body aches.

EXAM:
PORTABLE CHEST 1 VIEW

[chest ap]
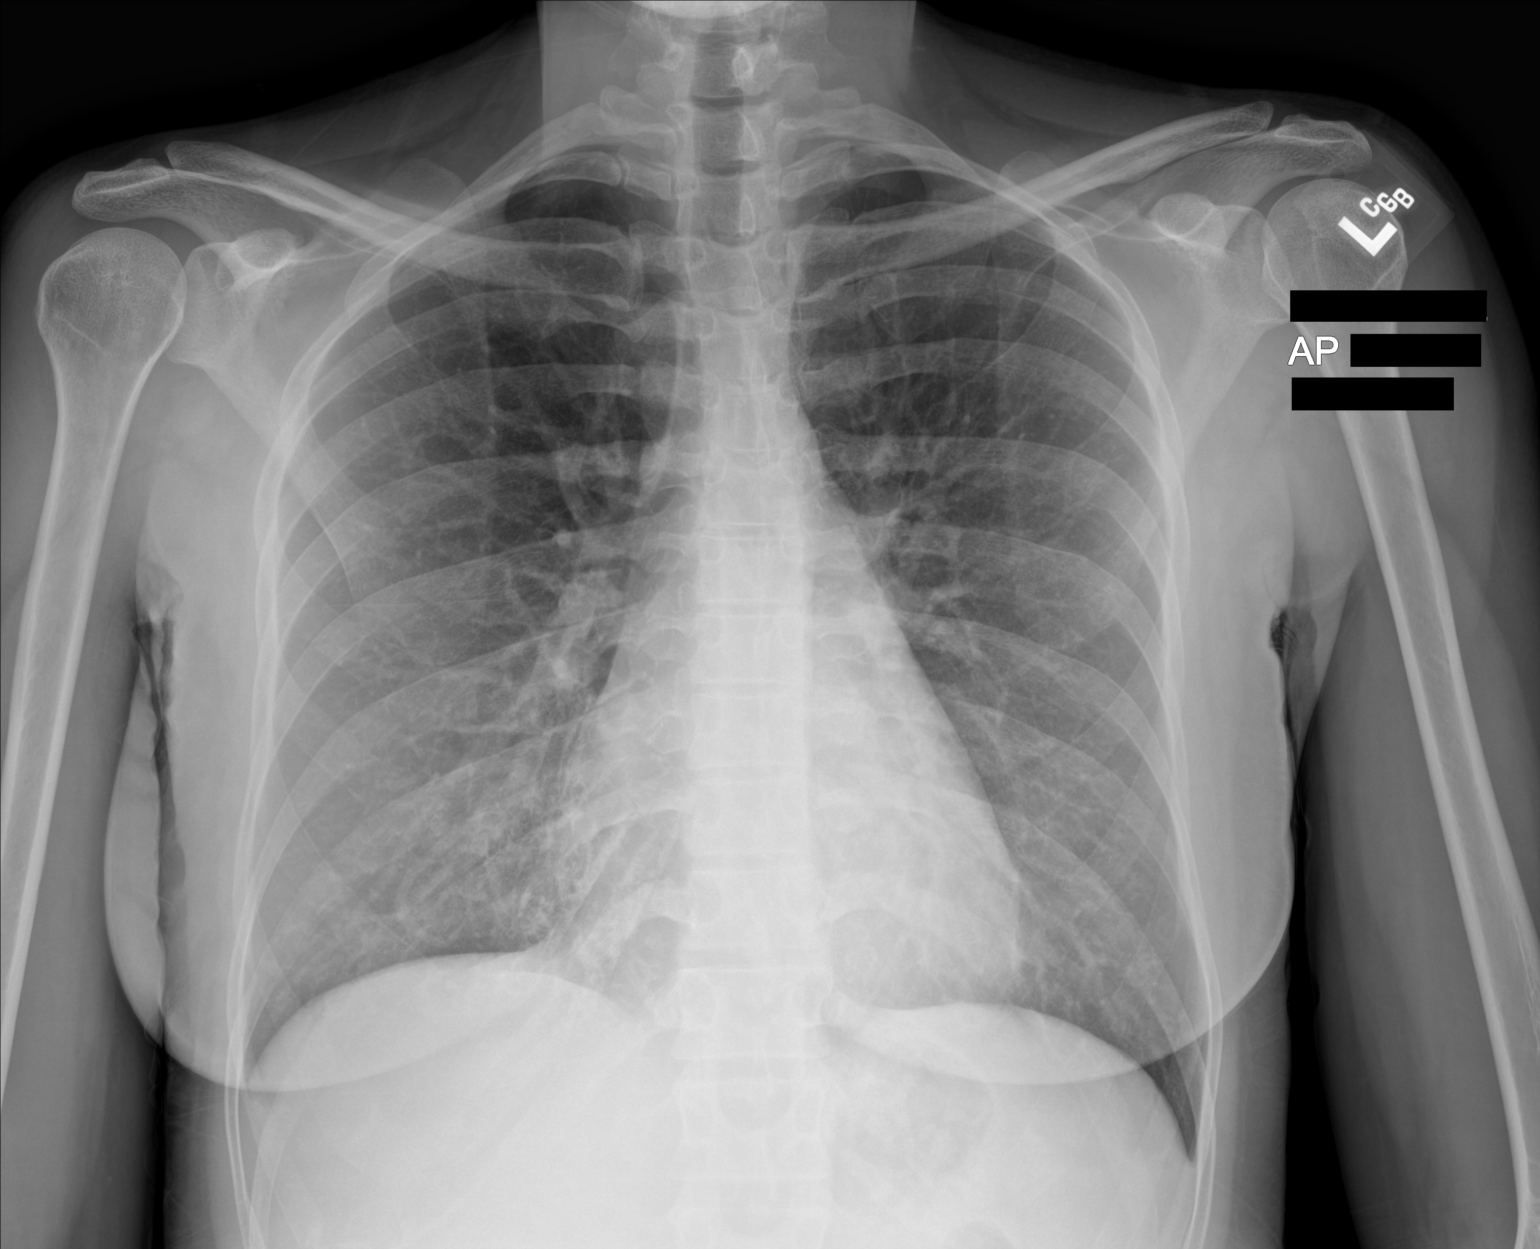

[1 of 1 positions shown; findings below may reference images not displayed]

FINDINGS: Normal cardiac and mediastinal contours. No large area pulmonary
consolidation. No pleural effusion or pneumothorax. Osseous
structures unremarkable.
IMPRESSION: No active disease.

## 2023-02-17 ENCOUNTER — Other Ambulatory Visit: Payer: Self-pay

## 2023-02-17 ENCOUNTER — Emergency Department
Admission: EM | Admit: 2023-02-17 | Discharge: 2023-02-17 | Disposition: A | Discharge status: Home / Self Care | Payer: Medicaid Other | Attending: Emergency Medicine | Admitting: Emergency Medicine

## 2023-02-17 DIAGNOSIS — R059 Cough, unspecified: Secondary | ICD-10-CM | POA: Diagnosis present

## 2023-02-17 DIAGNOSIS — U071 COVID-19: Secondary | ICD-10-CM | POA: Insufficient documentation

## 2023-02-17 LAB — RESP PANEL BY RT-PCR (RSV, FLU A&B, COVID)  RVPGX2
Influenza A by PCR: NEGATIVE
Influenza B by PCR: NEGATIVE
Resp Syncytial Virus by PCR: NEGATIVE
SARS Coronavirus 2 by RT PCR: POSITIVE — AB

## 2023-02-17 NOTE — Discharge Instructions (Signed)
Continue taking Tylenol or ibuprofen as needed for body aches, fever or headache.  Increase fluids to stay hydrated.  You are positive for COVID and will need to quarantine an additional 3 days.

## 2023-02-17 NOTE — ED Triage Notes (Signed)
Pt to ED for flu like sx started a couple days ago. Reports hot and cold chills, cough, congestion. Son recently had pink eye. Mother tested positive for covid and lives in same house.

## 2023-02-17 NOTE — ED Provider Notes (Signed)
[facility] Provider Note    Event Date/Time   First MD Initiated Contact with Patient 02/17/23 0820     (approximate)   History   flu like sx    HPI  STACY ATAYDE is a 32 y.o. female presents to the ED with complaint of 2 days cough, congestion, not feeling well.  Patient reports that she was in close contact with someone in her family who tested positive for COVID.     Physical Exam   Triage Vital Signs: ED Triage Vitals  Enc Vitals Group     BP 02/17/23 0811 136/79     Pulse Rate 02/17/23 0811 (!) 110     Resp 02/17/23 0811 18     Temp 02/17/23 0811 97.6 F (36.4 C)     Temp src --      SpO2 02/17/23 0811 100 %     Weight 02/17/23 0812 155 lb (70.3 kg)     Height 02/17/23 0812 5\' 2"  (1.575 m)     Head Circumference --      Peak Flow --      Pain Score 02/17/23 0812 6     Pain Loc --      Pain Edu? --      Excl. in [place]? --     Most recent vital signs: Vitals:   02/17/23 0811  BP: 136/79  Pulse: (!) 110  Resp: 18  Temp: 97.6 F (36.4 C)  SpO2: 100%     General: Awake, no distress.  Able to talk in complete sentences without any difficulty. CV:  Good peripheral perfusion.  Heart regular rate and rhythm. Resp:  Normal effort.  Lungs are clear bilaterally. Abd:  No distention.  Other:  Nasal congestion.   ED Results / Procedures / Treatments   Labs (all labs ordered are listed, but only abnormal results are displayed) Labs Reviewed  RESP PANEL BY RT-PCR (RSV, FLU A&B, COVID)  [id number] - Abnormal; Notable for the following components:      Result Value   SARS Coronavirus 2 by RT PCR POSITIVE (*)    All other components within normal limits      PROCEDURES:  Critical Care performed:   Procedures   MEDICATIONS ORDERED IN ED: Medications - No data to display   IMPRESSION / MDM / [place] / ED COURSE  I reviewed the triage vital signs and the nursing notes.   Differential diagnosis includes, but is  not limited to, COVID, influenza, RSV, viral illness.  32 year old female presents to the ED with complaint of recent exposure and today not feeling well.  She is concerned that she has flu or COVID after 2 days of cough and congestion.  Patient did get the COVID-vaccine Moderna.  While waiting for the test results we did discuss that she would need to quarantine additional 3 days since she has had the COVID-vaccine.  Patient tested positive COVID however when I went into the room to advise patient patient had eloped from the room.  Prior to this we had a conversation that she would be able to see her results on MyChart and possibly this is what she decided without informing the nurses that she was leaving.     Patient's presentation is most consistent with acute complicated illness / injury requiring diagnostic workup.  FINAL CLINICAL IMPRESSION(S) / ED DIAGNOSES   Final diagnoses:  COVID     Rx / DC Orders   ED Discharge Orders  None        Note:  This document was prepared using Dragon voice recognition software and may include unintentional dictation errors.   Johnn Hai, PA-C 02/17/23 1458    Lavonia Drafts, MD 02/17/23 [phone]

## 2023-06-14 IMAGING — US US OB COMP +14 WK
1 series · 15 of 28 positions shown · non-contrast
Comparison: none

CLINICAL DATA: Growth, anatomy, tobacco smoking.

EXAM:
OBSTETRICAL ULTRASOUND >14 WKS AND BIOPHYSICAL PROFILE

[Series 1: us ob comp + 14 wk · 15 of 83 slices shown]
[im 1/83]
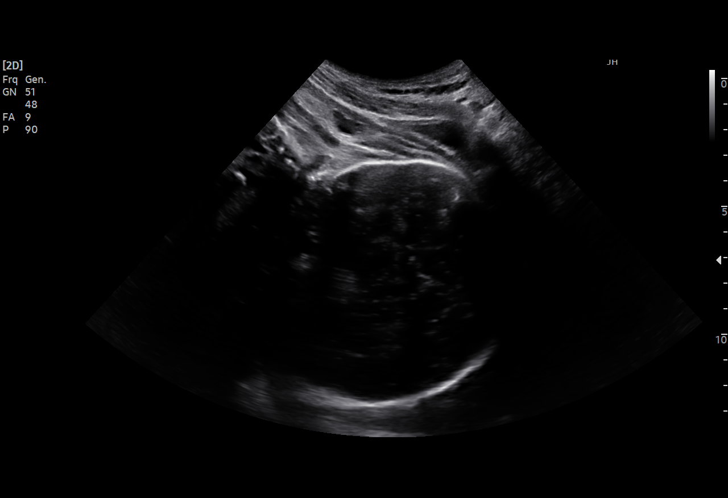
[im 7/83]
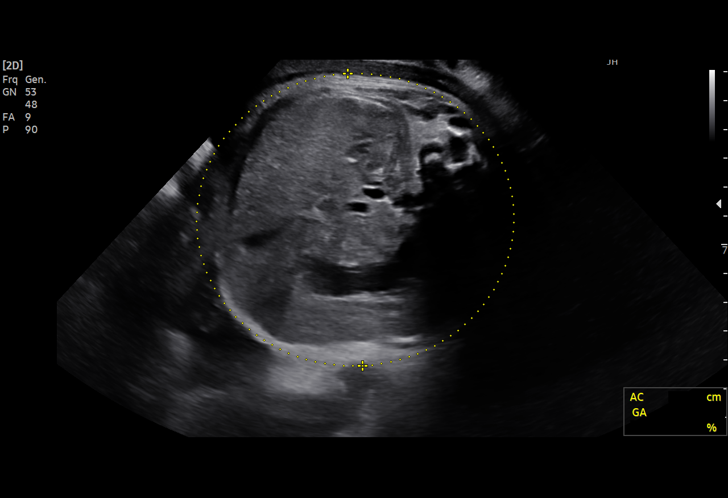
[im 13/83]
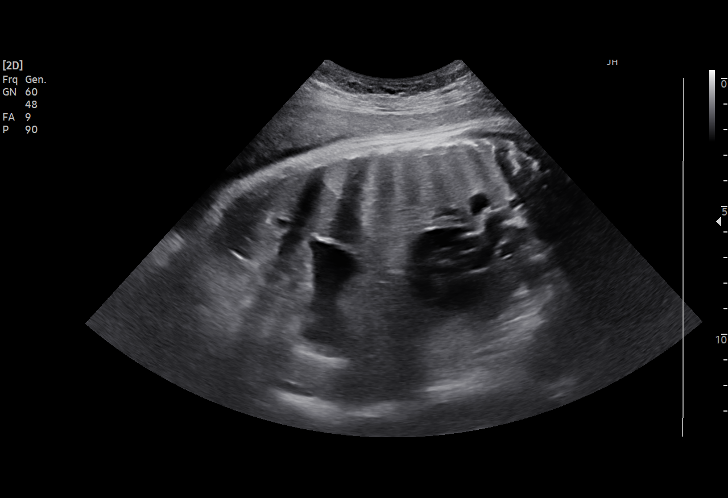
[im 19/83]
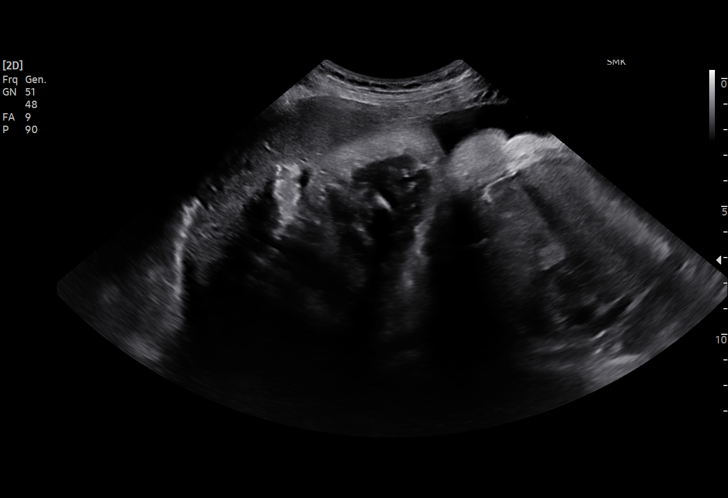
[im 25/83]
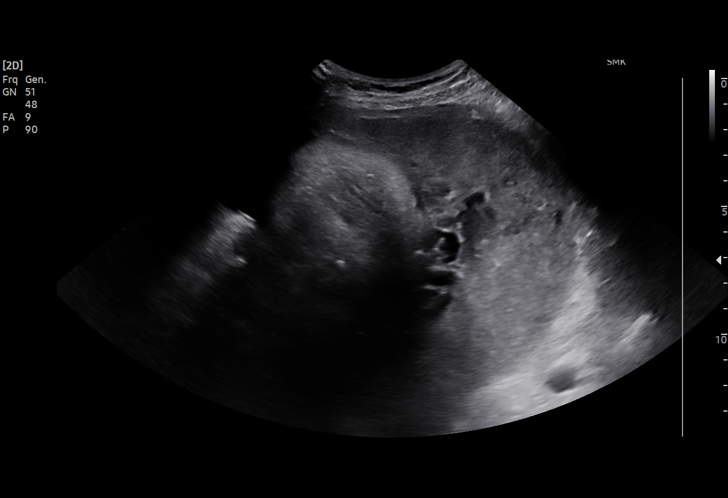
[im 31/83]
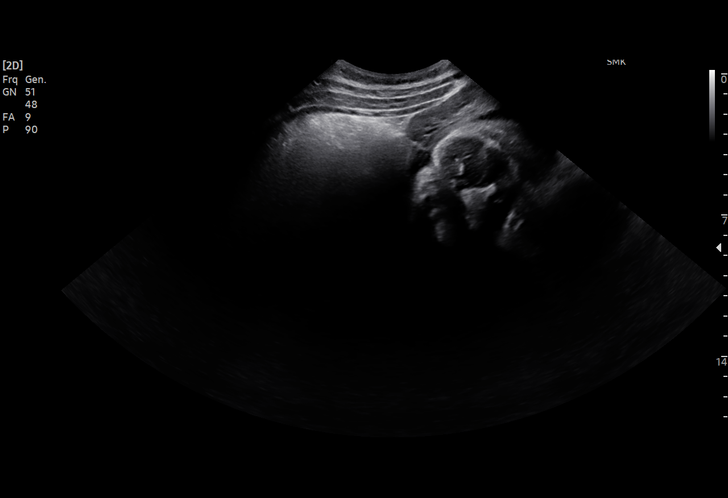
[im 37/83]
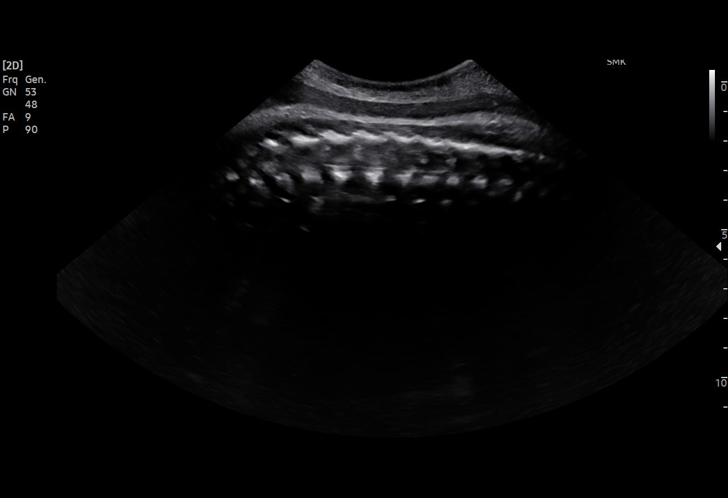
[im 43/83]
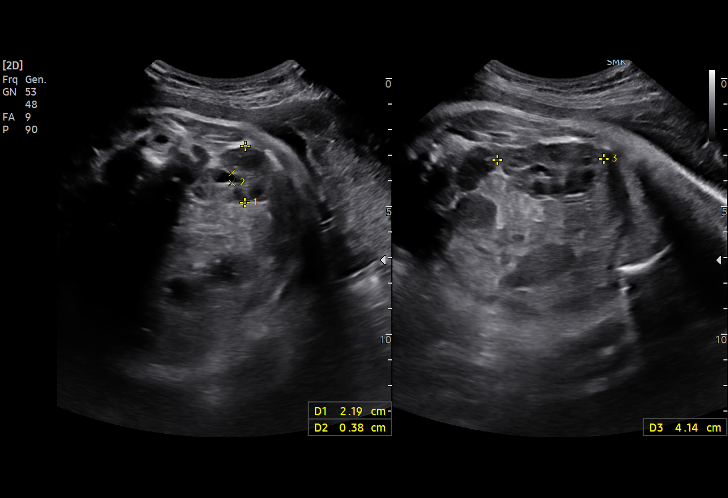
[im 46/83]
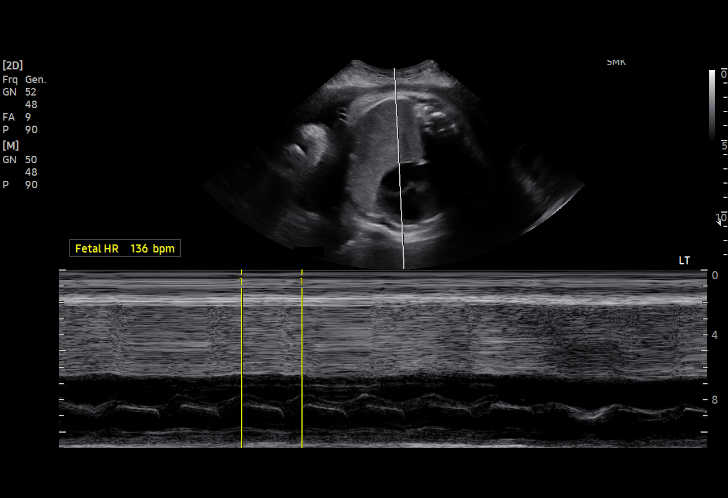
[im 52/83]
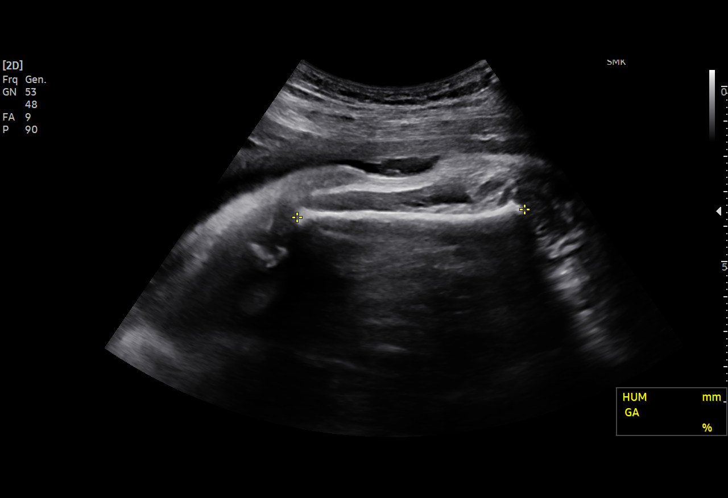
[im 58/83]
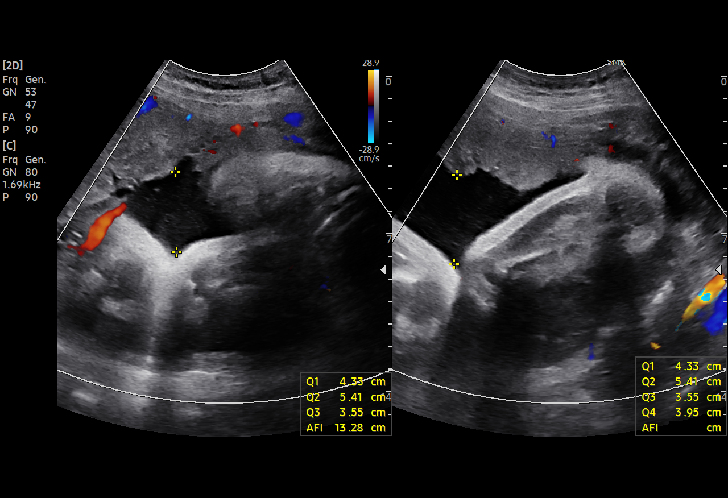
[im 64/83]
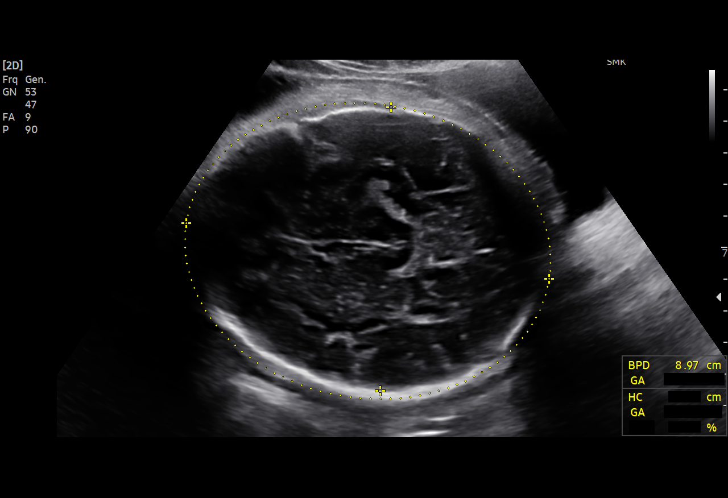
[im 70/83]
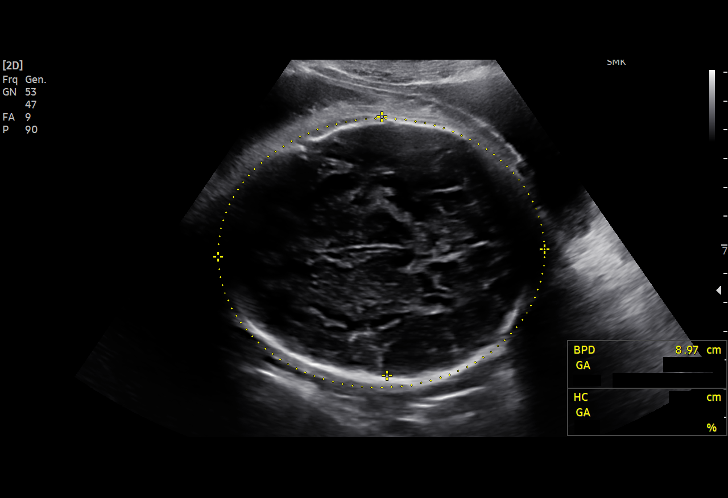
[im 76/83]
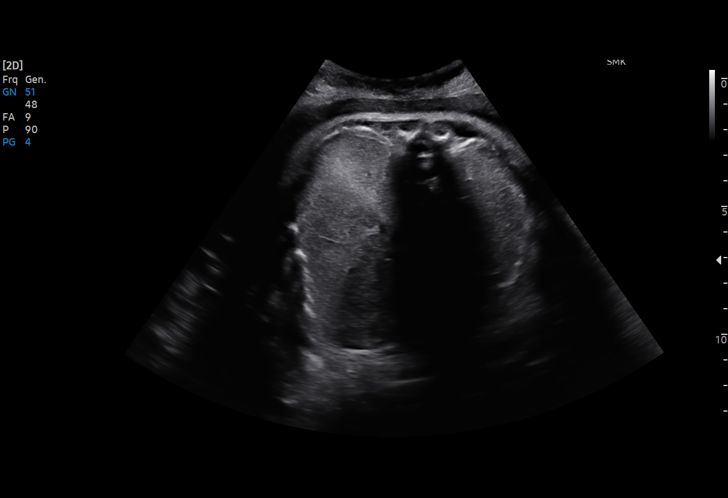
[im 83/83]
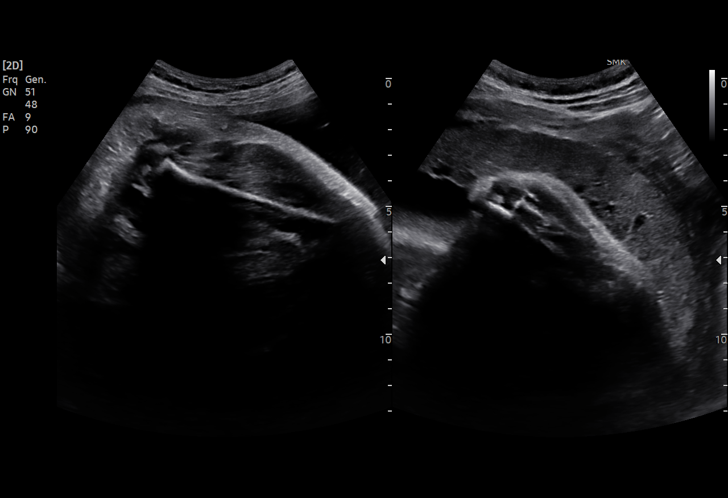

[15 of 28 positions shown; findings below may reference images not displayed]

FINDINGS: Number of Fetuses: 1

Heart Rate:  136 bpm

Movement: Yes

Presentation: Cephalic

Previa: No

Placental Location: Fundal

Amniotic Fluid (Subjective): Normal

Amniotic Fluid (Objective):

Vertical pocket 5.4cm

AFI 17.2 cm (5%ile= 7.5 cm, 95%= 24.4 cm for 37 wks)

FETAL BIOMETRY

BPD:  9.0cm 36w 3d

HC:    32.7cm 37w 1d

AC:   34.9cm 38w 5d

FL:   7.1cm 36w 3d

Current Mean GA: 37w 1d US EDC: 02/07/2022

Assigned GA: 37w 0d Assigned EDC: 02/08/2022

Estimated Fetal Weight:  3310g    83%ile

FETAL ANATOMY

Lateral Ventricles: Appears normal

Thalami/CSP: Appears normal

Posterior Fossa: Not visualized

Nuchal Region: Not visualized    NFT= N/A > 20 WKS

Upper Lip: Not visualized

Spine: Appears normal

4 Chamber Heart on Left: Appears normal

LVOT: Appears normal

RVOT: Appears normal

Stomach on Left: Appears normal

3 Vessel Cord: Appears normal

Cord Insertion site: Appears normal

Kidneys: Appears normal

Bladder: Appears normal

Extremities: Left lower extremity not well-seen, remaining appears
normal.

Sex: Not Visualized

Technical Limitations: Advanced gestational age

Maternal Findings:

Cervix:  Normal
IMPRESSION: 1. Single live intrauterine gestation with approximate gestational
age of 37 weeks and 1 day, EDC based on today's sonogram is
02/07/2022.

2. Visualized fetal anatomy is within normal limits. Limited
evaluation of the left lower extremity, nose/lips, posterior fossa,
genitalia due to advanced gestational age and fetal positioning.

## 2023-10-03 DIAGNOSIS — R7303 Prediabetes: Secondary | ICD-10-CM | POA: Diagnosis not present

## 2023-10-03 DIAGNOSIS — E785 Hyperlipidemia, unspecified: Secondary | ICD-10-CM | POA: Diagnosis not present

## 2023-10-07 DIAGNOSIS — Z720 Tobacco use: Secondary | ICD-10-CM | POA: Diagnosis not present

## 2023-10-07 DIAGNOSIS — M79645 Pain in left finger(s): Secondary | ICD-10-CM | POA: Diagnosis not present

## 2023-10-07 DIAGNOSIS — R7303 Prediabetes: Secondary | ICD-10-CM | POA: Diagnosis not present

## 2023-10-07 DIAGNOSIS — F439 Reaction to severe stress, unspecified: Secondary | ICD-10-CM | POA: Diagnosis not present

## 2023-10-07 DIAGNOSIS — E782 Mixed hyperlipidemia: Secondary | ICD-10-CM | POA: Diagnosis not present

## 2023-11-06 DIAGNOSIS — F320 Major depressive disorder, single episode, mild: Secondary | ICD-10-CM | POA: Diagnosis not present

## 2023-11-06 DIAGNOSIS — Z5948 Other specified lack of adequate food: Secondary | ICD-10-CM | POA: Diagnosis not present

## 2023-11-06 DIAGNOSIS — Z5986 Financial insecurity: Secondary | ICD-10-CM | POA: Diagnosis not present

## 2023-11-06 DIAGNOSIS — Z59811 Housing instability, housed, with risk of homelessness: Secondary | ICD-10-CM | POA: Diagnosis not present

## 2023-11-06 DIAGNOSIS — Z720 Tobacco use: Secondary | ICD-10-CM | POA: Diagnosis not present

## 2023-11-06 DIAGNOSIS — Z793 Long term (current) use of hormonal contraceptives: Secondary | ICD-10-CM | POA: Diagnosis not present

## 2023-12-31 DIAGNOSIS — Z124 Encounter for screening for malignant neoplasm of cervix: Secondary | ICD-10-CM | POA: Diagnosis not present

## 2023-12-31 DIAGNOSIS — Z113 Encounter for screening for infections with a predominantly sexual mode of transmission: Secondary | ICD-10-CM | POA: Diagnosis not present

## 2023-12-31 DIAGNOSIS — N898 Other specified noninflammatory disorders of vagina: Secondary | ICD-10-CM | POA: Diagnosis not present

## 2023-12-31 DIAGNOSIS — Z3202 Encounter for pregnancy test, result negative: Secondary | ICD-10-CM | POA: Diagnosis not present

## 2023-12-31 DIAGNOSIS — Z114 Encounter for screening for human immunodeficiency virus [HIV]: Secondary | ICD-10-CM | POA: Diagnosis not present

## 2023-12-31 DIAGNOSIS — Z01411 Encounter for gynecological examination (general) (routine) with abnormal findings: Secondary | ICD-10-CM | POA: Diagnosis not present

## 2023-12-31 DIAGNOSIS — Z3042 Encounter for surveillance of injectable contraceptive: Secondary | ICD-10-CM | POA: Diagnosis not present

## 2023-12-31 DIAGNOSIS — Z1151 Encounter for screening for human papillomavirus (HPV): Secondary | ICD-10-CM | POA: Diagnosis not present

## 2024-01-01 DIAGNOSIS — M65332 Trigger finger, left middle finger: Secondary | ICD-10-CM | POA: Diagnosis not present

## 2024-01-01 DIAGNOSIS — M65312 Trigger thumb, left thumb: Secondary | ICD-10-CM | POA: Diagnosis not present

## 2024-01-16 DIAGNOSIS — Z8619 Personal history of other infectious and parasitic diseases: Secondary | ICD-10-CM | POA: Diagnosis not present

## 2024-01-16 DIAGNOSIS — B009 Herpesviral infection, unspecified: Secondary | ICD-10-CM | POA: Diagnosis not present

## 2024-06-15 DIAGNOSIS — E7800 Pure hypercholesterolemia, unspecified: Secondary | ICD-10-CM | POA: Diagnosis not present

## 2024-06-15 DIAGNOSIS — Z720 Tobacco use: Secondary | ICD-10-CM | POA: Diagnosis not present

## 2024-06-15 DIAGNOSIS — Z0000 Encounter for general adult medical examination without abnormal findings: Secondary | ICD-10-CM | POA: Diagnosis not present

## 2024-06-15 DIAGNOSIS — Z130 Encounter for screening for diseases of the blood and blood-forming organs and certain disorders involving the immune mechanism: Secondary | ICD-10-CM | POA: Diagnosis not present

## 2024-06-15 DIAGNOSIS — Z1331 Encounter for screening for depression: Secondary | ICD-10-CM | POA: Diagnosis not present

## 2024-06-15 DIAGNOSIS — R7303 Prediabetes: Secondary | ICD-10-CM | POA: Diagnosis not present

## 2024-06-15 DIAGNOSIS — Z1329 Encounter for screening for other suspected endocrine disorder: Secondary | ICD-10-CM | POA: Diagnosis not present

## 2024-06-15 DIAGNOSIS — N912 Amenorrhea, unspecified: Secondary | ICD-10-CM | POA: Diagnosis not present

## 2024-06-21 DIAGNOSIS — Z3202 Encounter for pregnancy test, result negative: Secondary | ICD-10-CM | POA: Diagnosis not present

## 2024-06-21 DIAGNOSIS — Z30017 Encounter for initial prescription of implantable subdermal contraceptive: Secondary | ICD-10-CM | POA: Diagnosis not present
